# Patient Record
Sex: Male | Born: 1965 | Race: Black or African American | Hispanic: No | Marital: Married | Smoking: Former smoker
Health system: Southern US, Community
[De-identification: ages and names within clinical notes are randomized; demographics above are authoritative.]

## PROBLEM LIST (undated history)

## (undated) DIAGNOSIS — I10 Essential (primary) hypertension: Secondary | ICD-10-CM

## (undated) DIAGNOSIS — E7800 Pure hypercholesterolemia, unspecified: Secondary | ICD-10-CM

## (undated) DIAGNOSIS — M1990 Unspecified osteoarthritis, unspecified site: Secondary | ICD-10-CM

## (undated) DIAGNOSIS — R7989 Other specified abnormal findings of blood chemistry: Secondary | ICD-10-CM

---

## 2001-12-15 ENCOUNTER — Encounter: Payer: Self-pay | Admitting: Family Medicine

## 2001-12-15 ENCOUNTER — Ambulatory Visit (HOSPITAL_COMMUNITY): Admission: RE | Admit: 2001-12-15 | Discharge: 2001-12-15 | Payer: Self-pay | Admitting: Family Medicine

## 2010-03-02 ENCOUNTER — Emergency Department (HOSPITAL_COMMUNITY): Admission: EM | Admit: 2010-03-02 | Discharge: 2010-03-02 | Payer: Self-pay | Admitting: Emergency Medicine

## 2010-12-13 ENCOUNTER — Emergency Department (HOSPITAL_COMMUNITY)
Admission: EM | Admit: 2010-12-13 | Discharge: 2010-12-14 | Payer: Self-pay | Source: Home / Self Care | Admitting: Emergency Medicine

## 2010-12-13 LAB — CBC
HCT: 41.9 % (ref 39.0–52.0)
Hemoglobin: 15.1 g/dL (ref 13.0–17.0)
MCH: 31.2 pg (ref 26.0–34.0)
MCHC: 36 g/dL (ref 30.0–36.0)
MCV: 86.6 fL (ref 78.0–100.0)
Platelets: 166 10*3/uL (ref 150–400)
RBC: 4.84 MIL/uL (ref 4.22–5.81)
RDW: 12.4 % (ref 11.5–15.5)
WBC: 10 10*3/uL (ref 4.0–10.5)

## 2010-12-13 LAB — DIFFERENTIAL
Basophils Absolute: 0 10*3/uL (ref 0.0–0.1)
Basophils Relative: 0 % (ref 0–1)
Eosinophils Absolute: 0 10*3/uL (ref 0.0–0.7)
Eosinophils Relative: 0 % (ref 0–5)
Lymphocytes Relative: 3 % — ABNORMAL LOW (ref 12–46)
Lymphs Abs: 0.3 10*3/uL — ABNORMAL LOW (ref 0.7–4.0)
Monocytes Absolute: 0.3 10*3/uL (ref 0.1–1.0)
Monocytes Relative: 3 % (ref 3–12)
Neutro Abs: 9.4 10*3/uL — ABNORMAL HIGH (ref 1.7–7.7)
Neutrophils Relative %: 94 % — ABNORMAL HIGH (ref 43–77)

## 2010-12-14 LAB — URINALYSIS, ROUTINE W REFLEX MICROSCOPIC
Hgb urine dipstick: NEGATIVE
Leukocytes, UA: NEGATIVE
Nitrite: NEGATIVE
Protein, ur: 30 mg/dL — AB
Specific Gravity, Urine: >1.03 — ABNORMAL HIGH (ref 1.005–1.030)
Urine Glucose, Fasting: NEGATIVE mg/dL
Urobilinogen, UA: 0.2 mg/dL (ref 0.0–1.0)
pH: 5.5 (ref 5.0–8.0)

## 2010-12-14 LAB — BASIC METABOLIC PANEL
BUN: 12 mg/dL (ref 6–23)
CO2: 26 mEq/L (ref 19–32)
Calcium: 8.9 mg/dL (ref 8.4–10.5)
Chloride: 99 mEq/L (ref 96–112)
Creatinine, Ser: 1.25 mg/dL (ref 0.4–1.5)
GFR calc Af Amer: >60 mL/min (ref >60)
GFR calc non Af Amer: >60 mL/min (ref >60)
Glucose, Bld: 108 mg/dL — ABNORMAL HIGH (ref 70–99)
Potassium: 4 mEq/L (ref 3.5–5.1)
Sodium: 135 mEq/L (ref 135–145)

## 2010-12-14 LAB — URINE MICROSCOPIC-ADD ON

## 2011-02-05 LAB — BASIC METABOLIC PANEL
BUN: 12 mg/dL (ref 6–23)
Chloride: 104 mEq/L (ref 96–112)
Glucose, Bld: 91 mg/dL (ref 70–99)
Potassium: 4.2 mEq/L (ref 3.5–5.1)

## 2011-02-05 LAB — CBC
HCT: 40.9 % (ref 39.0–52.0)
Hemoglobin: 14.3 g/dL (ref 13.0–17.0)
MCV: 88.2 fL (ref 78.0–100.0)
Platelets: 183 10*3/uL (ref 150–400)
RDW: 12.2 % (ref 11.5–15.5)

## 2013-04-23 ENCOUNTER — Emergency Department (HOSPITAL_COMMUNITY)
Admission: EM | Admit: 2013-04-23 | Discharge: 2013-04-23 | Disposition: A | Discharge status: Home / Self Care | Payer: BC Managed Care – PPO | Attending: Emergency Medicine | Admitting: Emergency Medicine

## 2013-04-23 ENCOUNTER — Encounter (HOSPITAL_COMMUNITY): Payer: Self-pay | Admitting: *Deleted

## 2013-04-23 ENCOUNTER — Emergency Department (HOSPITAL_COMMUNITY): Payer: BC Managed Care – PPO

## 2013-04-23 ENCOUNTER — Inpatient Hospital Stay: Admit: 2013-04-23 | Payer: Self-pay | Admitting: Orthopaedic Surgery

## 2013-04-23 DIAGNOSIS — Z79899 Other long term (current) drug therapy: Secondary | ICD-10-CM | POA: Insufficient documentation

## 2013-04-23 DIAGNOSIS — I10 Essential (primary) hypertension: Secondary | ICD-10-CM | POA: Insufficient documentation

## 2013-04-23 DIAGNOSIS — R0602 Shortness of breath: Secondary | ICD-10-CM | POA: Insufficient documentation

## 2013-04-23 DIAGNOSIS — Z87891 Personal history of nicotine dependence: Secondary | ICD-10-CM | POA: Insufficient documentation

## 2013-04-23 DIAGNOSIS — R002 Palpitations: Secondary | ICD-10-CM | POA: Insufficient documentation

## 2013-04-23 DIAGNOSIS — R5381 Other malaise: Secondary | ICD-10-CM | POA: Insufficient documentation

## 2013-04-23 HISTORY — DX: Essential (primary) hypertension: I10

## 2013-04-23 LAB — CBC WITH DIFFERENTIAL/PLATELET
Basophils Absolute: 0 10*3/uL (ref 0.0–0.1)
Basophils Relative: 1 % (ref 0–1)
Eosinophils Absolute: 0.2 10*3/uL (ref 0.0–0.7)
HCT: 39.9 % (ref 39.0–52.0)
MCH: 30.4 pg (ref 26.0–34.0)
MCHC: 35.1 g/dL (ref 30.0–36.0)
Monocytes Absolute: 0.5 10*3/uL (ref 0.1–1.0)
Neutro Abs: 3.6 10*3/uL (ref 1.7–7.7)
RDW: 12.5 % (ref 11.5–15.5)

## 2013-04-23 LAB — COMPREHENSIVE METABOLIC PANEL
AST: 20 U/L (ref 0–37)
Albumin: 4 g/dL (ref 3.5–5.2)
BUN: 16 mg/dL (ref 6–23)
Calcium: 9.3 mg/dL (ref 8.4–10.5)
Chloride: 104 mEq/L (ref 96–112)
Creatinine, Ser: 1.06 mg/dL (ref 0.50–1.35)
Total Bilirubin: 0.4 mg/dL (ref 0.3–1.2)
Total Protein: 7.3 g/dL (ref 6.0–8.3)

## 2013-04-23 LAB — TROPONIN I: Troponin I: <0.3 ng/mL (ref <0.30)

## 2013-04-23 SURGERY — ARTHROPLASTY, HIP, TOTAL, ANTERIOR APPROACH
Anesthesia: Choice | Laterality: Left

## 2013-04-23 NOTE — ED Provider Notes (Signed)
History    This chart was scribed for [name] Lennert, MD by Toya Smothers, ED Scribe. The patient was seen in room APA07/APA07. Patient's care was started at 1525.  CSN: [id number]  Arrival date & time 04/23/13  1525   First MD Initiated Contact with Patient 04/23/13 1540      Chief Complaint  Patient presents with  . Tachycardia  . Fatigue    Patient is a 47 y.o. male presenting with palpitations. The history is provided by the patient. No language interpreter was used.  Palpitations Palpitations quality:  Fast Onset quality:  Sudden Duration:  1 hour Timing:  Rare Progression:  Resolved Chronicity:  New Associated symptoms: shortness of breath   Associated symptoms: no back pain, no chest pain and no cough   Risk factors comment:  HTN   HPI Comments:  [name] [name] is a 47 y.o. male with h/o HTN, who presents to the Emergency Department complaining of new, suden onset, episode of palpations while driving. Episode lasted for approximately 1 hour. Pain was mild, though it has subsided. Pt endorses associate mild SOB and fatigue. Symptoms have not been treated PTA. Pt denies headache, diaphoresis, fever, chills, nausea, vomiting, diarrhea, weakness, cough, and any other pain. Pt denies use of tobacco, alcohol, and illicit drug use. Pt denotes no hx of MI.     Past Medical History  Diagnosis Date  . Hypertension     History reviewed. No pertinent past surgical history.  History reviewed. No pertinent family history.  History  Substance Use Topics  . Smoking status: Former Games developer  . Smokeless tobacco: Not on file  . Alcohol Use: No      Review of Systems  Constitutional: Positive for fatigue. Negative for appetite change.  HENT: Negative for congestion, sinus pressure and ear discharge.   Eyes: Negative for discharge.  Respiratory: Positive for shortness of breath. Negative for cough.   Cardiovascular: Positive for palpitations. Negative for chest pain.   Gastrointestinal: Negative for abdominal pain and diarrhea.  Genitourinary: Negative for frequency and hematuria.  Musculoskeletal: Negative for back pain.  Skin: Negative for rash.  Neurological: Negative for seizures and headaches.  Psychiatric/Behavioral: Negative for hallucinations.    Allergies  Review of patient's allergies indicates not on file.  Home Medications   Current Outpatient Rx  Name  Route  Sig  Dispense  Refill  . amLODipine (NORVASC) 5 MG tablet   Oral   Take 5 mg by mouth daily.         . methocarbamol (ROBAXIN) 500 MG tablet   Oral   Take 500 mg by mouth every 6 (six) hours as needed (for  muscle spasms).         . naproxen (NAPROSYN) 500 MG tablet   Oral   Take 500 mg by mouth 2 (two) times daily as needed (for pain).         . traMADol (ULTRAM) 50 MG tablet   Oral   Take 50-100 mg by mouth 2 (two) times daily as needed for pain (for sever pain).           BP 129/85  Temp(Src) 98.7 F (37.1 C) (Oral)  Resp 24  Ht 5\' 11"  (1.803 m)  Wt 290 lb (131.543 kg)  BMI 40.46 kg/m2  SpO2 95%  Physical Exam  Constitutional: He is oriented to person, place, and time. He appears well-developed.  HENT:  Head: Normocephalic.  Eyes: Conjunctivae and EOM are normal. No scleral icterus.  Neck:  Neck supple. No thyromegaly present.  Cardiovascular: Normal rate and regular rhythm.  Exam reveals no gallop and no friction rub.   No murmur heard. Pulmonary/Chest: No stridor. He has no wheezes. He has no rales. He exhibits no tenderness.  Abdominal: He exhibits no distension. There is no tenderness. There is no rebound.  Musculoskeletal: Normal range of motion. He exhibits no edema.  Lymphadenopathy:    He has no cervical adenopathy.  Neurological: He is oriented to person, place, and time. Coordination normal.  Skin: No rash noted. No erythema.  Psychiatric: He has a normal mood and affect. His behavior is normal.    ED Course  Procedures DIAGNOSTIC  STUDIES: Oxygen Saturation is 95% on room air, adequate by my interpretation.    COORDINATION OF CARE: 16:00- Evaluated Pt. Pt is awake, alert, and without distress. 16:05- Patient  understand and agree with initial ED impression and plan with expectations set for ED visit.         Labs Reviewed  COMPREHENSIVE METABOLIC PANEL - Abnormal; Notable for the following:    Glucose, Bld 108 (*)    GFR calc non Af Amer 81 (*)    All other components within normal limits  CBC WITH DIFFERENTIAL  TROPONIN I   Dg Chest 2 View  04/23/2013   *RADIOLOGY REPORT*  Clinical Data: Shortness of breath, tachycardia  CHEST - 2 VIEW  Comparison: None.  Findings: Lungs are clear.  No pleural effusion or pneumothorax.  The heart is normal in size.  Visualized thoracolumbar spine is within normal limits. Degenerative changes of the left shoulder.  IMPRESSION: No evidence of acute cardiopulmonary disease.   Original Report Authenticated By: Charline Bills, M.D.     No diagnosis found.   Date: 04/23/2013  Rate: 88  Rhythm: normal sinus rhythm  QRS Axis: normal  Intervals: normal  ST/T Wave abnormalities: normal  Conduction Disutrbances:none  Narrative Interpretation:   Old EKG Reviewed: unchanged    MDM   Palpitaions hx of normal treadmill,  Will follow up with pcp    The chart was scribed for me under my direct supervision.  I personally performed the history, physical, and medical decision making and all procedures in the evaluation of this patient.. The chart was scribed for me under my direct supervision.  I personally performed the history, physical, and medical decision making and all procedures in the evaluation of this patient.[name] Lennert, MD 04/23/13 [phone]

## 2013-04-23 NOTE — ED Notes (Signed)
Pt c/o heart racing since 1300 today with SOB, denies CP

## 2013-05-03 ENCOUNTER — Other Ambulatory Visit (HOSPITAL_COMMUNITY): Payer: Self-pay | Admitting: Orthopaedic Surgery

## 2013-06-03 ENCOUNTER — Encounter (HOSPITAL_COMMUNITY): Payer: Self-pay | Admitting: Pharmacy Technician

## 2013-06-09 ENCOUNTER — Encounter (HOSPITAL_COMMUNITY)
Admission: RE | Admit: 2013-06-09 | Discharge: 2013-06-09 | Disposition: A | Discharge status: Home / Self Care | Payer: BC Managed Care – PPO | Source: Ambulatory Visit | Attending: Orthopaedic Surgery | Admitting: Orthopaedic Surgery

## 2013-06-09 ENCOUNTER — Encounter (HOSPITAL_COMMUNITY): Payer: Self-pay

## 2013-06-09 HISTORY — DX: Unspecified osteoarthritis, unspecified site: M19.90

## 2013-06-09 LAB — SURGICAL PCR SCREEN
MRSA, PCR: NEGATIVE
Staphylococcus aureus: POSITIVE — AB

## 2013-06-09 LAB — CBC
HCT: 42.4 % (ref 39.0–52.0)
Hemoglobin: 14.7 g/dL (ref 13.0–17.0)
MCHC: 34.7 g/dL (ref 30.0–36.0)

## 2013-06-09 LAB — URINALYSIS, ROUTINE W REFLEX MICROSCOPIC
Glucose, UA: NEGATIVE mg/dL
Hgb urine dipstick: NEGATIVE
Protein, ur: NEGATIVE mg/dL
Specific Gravity, Urine: 1.026 (ref 1.005–1.030)
pH: 6.5 (ref 5.0–8.0)

## 2013-06-09 LAB — BASIC METABOLIC PANEL
BUN: 18 mg/dL (ref 6–23)
Chloride: 99 mEq/L (ref 96–112)
GFR calc Af Amer: 70 mL/min — ABNORMAL LOW (ref >90)
Potassium: 4.3 mEq/L (ref 3.5–5.1)

## 2013-06-09 LAB — PROTIME-INR
INR: 1.05 (ref 0.00–1.49)
Prothrombin Time: 13.5 seconds (ref 11.6–15.2)

## 2013-06-09 LAB — APTT: aPTT: 30 seconds (ref 24–37)

## 2013-06-09 LAB — ABO/RH: ABO/RH(D): A POS

## 2013-06-09 NOTE — Progress Notes (Signed)
06/09/13 1629  OBSTRUCTIVE SLEEP APNEA  Have you ever been diagnosed with sleep apnea through a sleep study? No  Do you snore loudly (loud enough to be heard through closed doors)?  0  Do you often feel tired, fatigued, or sleepy during the daytime? 1  Has anyone observed you stop breathing during your sleep? 0  Do you have, or are you being treated for high blood pressure? 1  BMI more than 35 kg/m2? 1  Age over 49 years old? 0  Neck circumference greater than 40 cm/18 inches? 1  Gender: 1  Obstructive Sleep Apnea Score 5  Score 4 or greater  Results sent to PCP

## 2013-06-09 NOTE — Patient Instructions (Addendum)
YOUR SURGERY IS SCHEDULED AT [facility]  ON:  Friday 7/25  REPORT TO [facility] AT:  9:30 AM      PHONE # FOR SHORT STAY IS [phone]  DO NOT EAT  ANYTHING AFTER MIDNIGHT THE NIGHT BEFORE YOUR SURGERY.  NO FOOD, NO CHEWING GUM, NO MINTS, NO CANDIES, NO CHEWING TOBACCO.  YOU MAY HAVE CLEAR LIQUIDS TO DRINK FROM MIDNIGHT UNTIL 6:00 AM DAY OF YOUR SURGERY - LIKE WATER, SODA.       NOTHING TO DRINK AFTER 6:00 AM DAY OF YOUR SURGERY.  PLEASE TAKE THE FOLLOWING MEDICATIONS THE AM OF YOUR SURGERY WITH A FEW SIPS OF WATER:  AMLODIPINE, TRAMADOL   DO NOT BRING VALUABLES, MONEY, CREDIT CARDS.  DO NOT WEAR JEWELRY, MAKE-UP, NAIL POLISH AND NO METAL PINS OR CLIPS IN YOUR HAIR. CONTACT LENS, DENTURES / PARTIALS, GLASSES SHOULD NOT BE WORN TO SURGERY AND IN MOST CASES-HEARING AIDS WILL NEED TO BE REMOVED.  BRING YOUR GLASSES CASE, ANY EQUIPMENT NEEDED FOR YOUR CONTACT LENS. FOR PATIENTS ADMITTED TO THE HOSPITAL--CHECK OUT TIME THE DAY OF DISCHARGE IS 11:00 AM.  ALL INPATIENT ROOMS ARE PRIVATE - WITH BATHROOM, TELEPHONE, TELEVISION AND WIFI INTERNET.                            PLEASE READ OVER ANY  FACT SHEETS THAT YOU WERE GIVEN: MRSA INFORMATION, BLOOD TRANSFUSION FACT SHEET. FAILURE TO FOLLOW THESE INSTRUCTIONS MAY RESULT IN THE CANCELLATION OF YOUR SURGERY.   PATIENT SIGNATURE_________________________________

## 2013-06-09 NOTE — Pre-Procedure Instructions (Signed)
EKG AND CXR REPORTS ARE IN EPIC FROM 04/23/13.

## 2013-06-11 ENCOUNTER — Inpatient Hospital Stay (HOSPITAL_COMMUNITY)
Admission: RE | Admit: 2013-06-11 | Discharge: 2013-06-13 | DRG: 818 | Disposition: A | Discharge status: Home Health | Payer: BC Managed Care – PPO | Source: Ambulatory Visit | Attending: Orthopaedic Surgery | Admitting: Orthopaedic Surgery

## 2013-06-11 ENCOUNTER — Ambulatory Visit (HOSPITAL_COMMUNITY): Payer: BC Managed Care – PPO

## 2013-06-11 ENCOUNTER — Encounter (HOSPITAL_COMMUNITY)
Admission: RE | Disposition: A | Discharge status: Home / Self Care | Payer: Self-pay | Source: Ambulatory Visit | Attending: Orthopaedic Surgery

## 2013-06-11 ENCOUNTER — Inpatient Hospital Stay (HOSPITAL_COMMUNITY): Payer: BC Managed Care – PPO

## 2013-06-11 ENCOUNTER — Encounter (HOSPITAL_COMMUNITY): Payer: Self-pay | Admitting: Anesthesiology

## 2013-06-11 ENCOUNTER — Encounter (HOSPITAL_COMMUNITY): Payer: Self-pay | Admitting: *Deleted

## 2013-06-11 ENCOUNTER — Ambulatory Visit (HOSPITAL_COMMUNITY): Payer: BC Managed Care – PPO | Admitting: Anesthesiology

## 2013-06-11 DIAGNOSIS — M169 Osteoarthritis of hip, unspecified: Secondary | ICD-10-CM

## 2013-06-11 DIAGNOSIS — Z6841 Body Mass Index (BMI) 40.0 and over, adult: Secondary | ICD-10-CM

## 2013-06-11 DIAGNOSIS — I10 Essential (primary) hypertension: Secondary | ICD-10-CM | POA: Diagnosis present

## 2013-06-11 DIAGNOSIS — Z87891 Personal history of nicotine dependence: Secondary | ICD-10-CM

## 2013-06-11 DIAGNOSIS — M1610 Unilateral primary osteoarthritis, unspecified hip: Principal | ICD-10-CM | POA: Diagnosis present

## 2013-06-11 HISTORY — PX: TOTAL HIP ARTHROPLASTY: SHX124

## 2013-06-11 SURGERY — ARTHROPLASTY, HIP, TOTAL, ANTERIOR APPROACH
Anesthesia: General | Site: Hip | Laterality: Right | Wound class: Clean

## 2013-06-11 MED ORDER — SODIUM CHLORIDE 0.9 % IR SOLN
Status: DC | PRN
  Administered 2013-06-11: 1000 mL

## 2013-06-11 MED ORDER — ALUM & MAG HYDROXIDE-SIMETH 200-200-20 MG/5ML PO SUSP: 30.0000 mL | ORAL | Status: DC | PRN

## 2013-06-11 MED ORDER — SUCCINYLCHOLINE CHLORIDE 20 MG/ML IJ SOLN
INTRAMUSCULAR | Status: DC | PRN
  Administered 2013-06-11: 140 mg via INTRAVENOUS

## 2013-06-11 MED ORDER — HYDROMORPHONE HCL PF 1 MG/ML IJ SOLN: 1.0000 mg | INTRAMUSCULAR | Status: DC | PRN

## 2013-06-11 MED ORDER — OXYCODONE HCL 5 MG PO TABS
5.0000 mg | ORAL_TABLET | ORAL | Status: DC | PRN
  Administered 2013-06-11: 10 mg via ORAL
  Administered 2013-06-12: 5 mg via ORAL
  Administered 2013-06-12: 10 mg via ORAL
  Filled 2013-06-11: qty 2
  Filled 2013-06-11: qty 1
  Filled 2013-06-11: qty 2

## 2013-06-11 MED ORDER — ONDANSETRON HCL 4 MG/2ML IJ SOLN: 4.0000 mg | Freq: Four times a day (QID) | INTRAMUSCULAR | Status: DC | PRN

## 2013-06-11 MED ORDER — ZOLPIDEM TARTRATE 5 MG PO TABS: 5.0000 mg | ORAL_TABLET | Freq: Every evening | ORAL | Status: DC | PRN

## 2013-06-11 MED ORDER — SODIUM CHLORIDE 0.9 % IV SOLN
INTRAVENOUS | Status: DC
  Administered 2013-06-11: 17:00:00 via INTRAVENOUS

## 2013-06-11 MED ORDER — METHOCARBAMOL 100 MG/ML IJ SOLN
500.0000 mg | Freq: Four times a day (QID) | INTRAVENOUS | Status: DC | PRN
  Administered 2013-06-11: 500 mg via INTRAVENOUS
  Filled 2013-06-11: qty 5

## 2013-06-11 MED ORDER — CISATRACURIUM BESYLATE (PF) 10 MG/5ML IV SOLN
INTRAVENOUS | Status: DC | PRN
  Administered 2013-06-11: 10 mg via INTRAVENOUS
  Administered 2013-06-11 (×2): 2 mg via INTRAVENOUS

## 2013-06-11 MED ORDER — METHOCARBAMOL 500 MG PO TABS
500.0000 mg | ORAL_TABLET | Freq: Four times a day (QID) | ORAL | Status: DC | PRN
  Administered 2013-06-12: 500 mg via ORAL
  Filled 2013-06-11: qty 1

## 2013-06-11 MED ORDER — AMLODIPINE BESYLATE 5 MG PO TABS
5.0000 mg | ORAL_TABLET | Freq: Every morning | ORAL | Status: DC
  Filled 2013-06-11 (×2): qty 1

## 2013-06-11 MED ORDER — DOCUSATE SODIUM 100 MG PO CAPS
100.0000 mg | ORAL_CAPSULE | Freq: Two times a day (BID) | ORAL | Status: DC
  Administered 2013-06-11 – 2013-06-13 (×4): 100 mg via ORAL
  Filled 2013-06-11 (×6): qty 1

## 2013-06-11 MED ORDER — HYDROMORPHONE HCL PF 1 MG/ML IJ SOLN
0.2500 mg | INTRAMUSCULAR | Status: DC | PRN
  Administered 2013-06-11: 0.25 mg via INTRAVENOUS

## 2013-06-11 MED ORDER — METOCLOPRAMIDE HCL 10 MG PO TABS: 5.0000 mg | ORAL_TABLET | Freq: Three times a day (TID) | ORAL | Status: DC | PRN

## 2013-06-11 MED ORDER — LACTATED RINGERS IV SOLN
INTRAVENOUS | Status: DC
Start: 2013-06-11 — End: 2013-06-11
  Administered 2013-06-11: 1000 mL via INTRAVENOUS
  Administered 2013-06-11 (×2): via INTRAVENOUS

## 2013-06-11 MED ORDER — ONDANSETRON HCL 4 MG/2ML IJ SOLN
INTRAMUSCULAR | Status: DC | PRN
  Administered 2013-06-11 (×2): 2 mg via INTRAVENOUS

## 2013-06-11 MED ORDER — CEFAZOLIN SODIUM-DEXTROSE 2-3 GM-% IV SOLR
INTRAVENOUS | Status: AC
  Filled 2013-06-11: qty 50

## 2013-06-11 MED ORDER — NEOSTIGMINE METHYLSULFATE 1 MG/ML IJ SOLN
INTRAMUSCULAR | Status: DC | PRN
  Administered 2013-06-11: 2 mg via INTRAVENOUS

## 2013-06-11 MED ORDER — PHENOL 1.4 % MT LIQD: 1.0000 | OROMUCOSAL | Status: DC | PRN

## 2013-06-11 MED ORDER — MIDAZOLAM HCL 5 MG/5ML IJ SOLN
INTRAMUSCULAR | Status: DC | PRN
  Administered 2013-06-11 (×2): 1 mg via INTRAVENOUS

## 2013-06-11 MED ORDER — PROMETHAZINE HCL 25 MG/ML IJ SOLN: 6.2500 mg | INTRAMUSCULAR | Status: DC | PRN

## 2013-06-11 MED ORDER — POLYETHYLENE GLYCOL 3350 17 G PO PACK
17.0000 g | PACK | Freq: Every day | ORAL | Status: DC | PRN
  Filled 2013-06-11: qty 1

## 2013-06-11 MED ORDER — DEXAMETHASONE SODIUM PHOSPHATE 10 MG/ML IJ SOLN
INTRAMUSCULAR | Status: DC | PRN
  Administered 2013-06-11: 10 mg via INTRAVENOUS

## 2013-06-11 MED ORDER — FENTANYL CITRATE 0.05 MG/ML IJ SOLN
INTRAMUSCULAR | Status: DC | PRN
  Administered 2013-06-11 (×3): 50 ug via INTRAVENOUS
  Administered 2013-06-11: 100 ug via INTRAVENOUS
  Administered 2013-06-11 (×3): 50 ug via INTRAVENOUS

## 2013-06-11 MED ORDER — CEFAZOLIN SODIUM-DEXTROSE 2-3 GM-% IV SOLR
2.0000 g | Freq: Four times a day (QID) | INTRAVENOUS | Status: AC
  Administered 2013-06-11 (×2): 2 g via INTRAVENOUS
  Filled 2013-06-11 (×2): qty 50

## 2013-06-11 MED ORDER — EPHEDRINE SULFATE 50 MG/ML IJ SOLN
INTRAMUSCULAR | Status: DC | PRN
  Administered 2013-06-11 (×4): 5 mg via INTRAVENOUS

## 2013-06-11 MED ORDER — ONDANSETRON HCL 4 MG PO TABS: 4.0000 mg | ORAL_TABLET | Freq: Four times a day (QID) | ORAL | Status: DC | PRN

## 2013-06-11 MED ORDER — MENTHOL 3 MG MT LOZG
1.0000 | LOZENGE | OROMUCOSAL | Status: DC | PRN
  Filled 2013-06-11: qty 9

## 2013-06-11 MED ORDER — METOCLOPRAMIDE HCL 5 MG/ML IJ SOLN: 5.0000 mg | Freq: Three times a day (TID) | INTRAMUSCULAR | Status: DC | PRN

## 2013-06-11 MED ORDER — GLYCOPYRROLATE 0.2 MG/ML IJ SOLN
INTRAMUSCULAR | Status: DC | PRN
  Administered 2013-06-11: 0.2 mg via INTRAVENOUS
  Administered 2013-06-11: .4 mg via INTRAVENOUS

## 2013-06-11 MED ORDER — ASPIRIN EC 325 MG PO TBEC
325.0000 mg | DELAYED_RELEASE_TABLET | Freq: Two times a day (BID) | ORAL | Status: DC
  Administered 2013-06-11 – 2013-06-13 (×4): 325 mg via ORAL
  Filled 2013-06-11 (×6): qty 1

## 2013-06-11 MED ORDER — DEXTROSE 5 % IV SOLN
3.0000 g | INTRAVENOUS | Status: AC
  Administered 2013-06-11: 3 g via INTRAVENOUS
  Filled 2013-06-11: qty 3000

## 2013-06-11 MED ORDER — HYDROMORPHONE HCL PF 1 MG/ML IJ SOLN
INTRAMUSCULAR | Status: AC
  Filled 2013-06-11: qty 1

## 2013-06-11 MED ORDER — ACETAMINOPHEN 325 MG PO TABS
650.0000 mg | ORAL_TABLET | Freq: Four times a day (QID) | ORAL | Status: DC | PRN
  Administered 2013-06-12: 650 mg via ORAL
  Filled 2013-06-11: qty 2

## 2013-06-11 MED ORDER — ACETAMINOPHEN 650 MG RE SUPP: 650.0000 mg | Freq: Four times a day (QID) | RECTAL | Status: DC | PRN

## 2013-06-11 MED ORDER — PROPOFOL 10 MG/ML IV BOLUS
INTRAVENOUS | Status: DC | PRN
  Administered 2013-06-11: 225 mg via INTRAVENOUS

## 2013-06-11 MED ORDER — CEFAZOLIN SODIUM 1-5 GM-% IV SOLN
INTRAVENOUS | Status: AC
  Filled 2013-06-11: qty 50

## 2013-06-11 MED ORDER — DIPHENHYDRAMINE HCL 12.5 MG/5ML PO ELIX: 12.5000 mg | ORAL_SOLUTION | ORAL | Status: DC | PRN

## 2013-06-11 MED ORDER — 0.9 % SODIUM CHLORIDE (POUR BTL) OPTIME
TOPICAL | Status: DC | PRN
  Administered 2013-06-11: 1000 mL

## 2013-06-11 MED ORDER — OXYCODONE HCL ER 20 MG PO T12A
20.0000 mg | EXTENDED_RELEASE_TABLET | Freq: Two times a day (BID) | ORAL | Status: DC
  Administered 2013-06-11 – 2013-06-13 (×4): 20 mg via ORAL
  Filled 2013-06-11 (×4): qty 1

## 2013-06-11 MED ORDER — LIDOCAINE HCL (CARDIAC) 20 MG/ML IV SOLN
INTRAVENOUS | Status: DC | PRN
  Administered 2013-06-11: 100 mg via INTRAVENOUS

## 2013-06-11 SURGICAL SUPPLY — 39 items
ADH SKN CLS APL DERMABOND .7 (GAUZE/BANDAGES/DRESSINGS) ×1
BAG SPEC THK2 15X12 ZIP CLS (MISCELLANEOUS)
BAG ZIPLOCK 12X15 (MISCELLANEOUS) ×2 IMPLANT
BLADE SAW SGTL 18X1.27X75 (BLADE) ×2 IMPLANT
CAPT HIP PF COP ×1 IMPLANT
CELLS DAT CNTRL 66122 CELL SVR (MISCELLANEOUS) ×1 IMPLANT
CLOTH BEACON ORANGE TIMEOUT ST (SAFETY) ×2 IMPLANT
DERMABOND ADVANCED (GAUZE/BANDAGES/DRESSINGS) ×1
DERMABOND ADVANCED .7 DNX12 (GAUZE/BANDAGES/DRESSINGS) ×1 IMPLANT
DRAPE C-ARM 42X120 X-RAY (DRAPES) ×2 IMPLANT
DRAPE STERI IOBAN 125X83 (DRAPES) ×2 IMPLANT
DRAPE U-SHAPE 47X51 STRL (DRAPES) ×6 IMPLANT
DRSG AQUACEL AG ADV 3.5X10 (GAUZE/BANDAGES/DRESSINGS) ×2 IMPLANT
DURAPREP 26ML APPLICATOR (WOUND CARE) ×2 IMPLANT
ELECT BLADE TIP CTD 4 INCH (ELECTRODE) ×2 IMPLANT
ELECT REM PT RETURN 9FT ADLT (ELECTROSURGICAL) ×2
ELECTRODE REM PT RTRN 9FT ADLT (ELECTROSURGICAL) ×1 IMPLANT
FACESHIELD LNG OPTICON STERILE (SAFETY) ×8 IMPLANT
GLOVE BIO SURGEON STRL SZ7.5 (GLOVE) ×2 IMPLANT
GLOVE BIOGEL PI IND STRL 8 (GLOVE) ×2 IMPLANT
GLOVE BIOGEL PI INDICATOR 8 (GLOVE) ×2
GLOVE ECLIPSE 8.0 STRL XLNG CF (GLOVE) ×2 IMPLANT
GOWN STRL REIN XL XLG (GOWN DISPOSABLE) ×4 IMPLANT
HANDPIECE INTERPULSE COAX TIP (DISPOSABLE) ×2
KIT BASIN OR (CUSTOM PROCEDURE TRAY) ×2 IMPLANT
PACK TOTAL JOINT (CUSTOM PROCEDURE TRAY) ×2 IMPLANT
PADDING CAST COTTON 6X4 STRL (CAST SUPPLIES) ×2 IMPLANT
RTRCTR WOUND ALEXIS 18CM MED (MISCELLANEOUS) ×2
SET HNDPC FAN SPRY TIP SCT (DISPOSABLE) ×1 IMPLANT
SUT ETHIBOND NAB CT1 #1 30IN (SUTURE) ×2 IMPLANT
SUT ETHILON 3 0 PS 1 (SUTURE) ×1 IMPLANT
SUT MNCRL AB 4-0 PS2 18 (SUTURE) ×2 IMPLANT
SUT VIC AB 0 CT1 36 (SUTURE) ×2 IMPLANT
SUT VIC AB 1 CT1 36 (SUTURE) ×4 IMPLANT
SUT VIC AB 2-0 CT1 27 (SUTURE) ×6
SUT VIC AB 2-0 CT1 TAPERPNT 27 (SUTURE) ×2 IMPLANT
TOWEL OR 17X26 10 PK STRL BLUE (TOWEL DISPOSABLE) ×2 IMPLANT
TOWEL OR NON WOVEN STRL DISP B (DISPOSABLE) ×2 IMPLANT
TRAY FOLEY METER SIL LF 16FR (CATHETERS) ×1 IMPLANT

## 2013-06-11 NOTE — Anesthesia Procedure Notes (Addendum)
Procedure Name: Intubation Date/Time: 06/11/2013 12:13 PM Performed by: Edison Pace Pre-anesthesia Checklist: Patient identified, Timeout performed, Emergency Drugs available, Suction available and Patient being monitored Patient Re-evaluated:Patient Re-evaluated prior to inductionOxygen Delivery Method: Circle system utilized Preoxygenation: Pre-oxygenation with 100% oxygen Intubation Type: IV induction and Cricoid Pressure applied Ventilation: Two handed mask ventilation required Laryngoscope Size: Mac and 4 Grade View: Grade II Tube type: Oral Tube size: 8.0 mm Number of attempts: 1 (advanced once after initial placement due to too shallow. cuff leak.  sealed at 24 at lip) Airway Equipment and Method: Stylet Placement Confirmation: ETT inserted through vocal cords under direct vision,  breath sounds checked- equal and bilateral and positive ETCO2 Secured at: 24 cm Tube secured with: Tape Dental Injury: Teeth and Oropharynx as per pre-operative assessment  Difficulty Due To: Difficulty was anticipated, Difficult Airway- due to limited oral opening, Difficult Airway- due to dentition, Difficult Airway- due to anterior larynx, Difficult Airway- due to large tongue and Difficult Airway- due to reduced neck mobility Future Recommendations: Recommend- induction with short-acting agent, and alternative techniques readily available Comments: Loose teeth intact after intubation

## 2013-06-11 NOTE — Anesthesia Postprocedure Evaluation (Signed)
  Anesthesia Post-op Note  Patient: [name] [name]  Procedure(s) Performed: Procedure(s) (LRB): RIGHT TOTAL HIP ARTHROPLASTY ANTERIOR APPROACH (Right)  Patient Location: PACU  Anesthesia Type: General  Level of Consciousness: awake and alert   Airway and Oxygen Therapy: Patient Spontanous Breathing  Post-op Pain: mild  Post-op Assessment: Post-op Vital signs reviewed, Patient's Cardiovascular Status Stable, Respiratory Function Stable, Patent Airway and No signs of Nausea or vomiting  Last Vitals:  Filed Vitals:   06/11/13 1615  BP: 136/70  Pulse: 90  Temp: 36.3 C  Resp: 16    Post-op Vital Signs: stable   Complications: No apparent anesthesia complications

## 2013-06-11 NOTE — Op Note (Signed)
[name] [name], [name] [name]              ACCOUNT NO.:  [id number]  MEDICAL RECORD NO.:  [id number]  LOCATION:  [facility]                         FACILITY:  [facility]  PHYSICIAN:  Vanita Panda. Magnus Ivan, M.D.DATE OF BIRTH:  June 22, 1966  DATE OF PROCEDURE:  06/11/2013 DATE OF DISCHARGE:                              OPERATIVE REPORT   PREOPERATIVE DIAGNOSES:  Severe end-stage arthritis and degenerative joint disease, right hip.  POSTOPERATIVE DIAGNOSES:  Severe end-stage arthritis and degenerative joint disease, right hip.  PROCEDURE:  Right total hip arthroplasty through direct anterior approach.  IMPLANTS:  DePuy Sector Gription acetabular component size 52, apex hole eliminator guide, size 36 plus 4 polyethylene liner, size 12 Corail femoral component with varus offset (KLA), size 36 plus 1.5 ceramic hip ball.  SURGEON:  Vanita Panda. Magnus Ivan, M.D.  ASSISTANT:  Richardean Canal, PA-C.  ANESTHESIA:  General.  ANTIBIOTICS:  IV Ancef.  BLOOD LOSS:  Less than 500 mL.  COMPLICATIONS:  None.  INDICATIONS:  [name] [name] is a 47 year old obese individual with severe debilitating arthritis of both his hips of the right worse than the left.  It has become so painful to him.  He is a Naval architect.  He has x- rays that show complete loss of joint space with subchondral sclerosis, periarticular osteophytes, and cystic changes.  With the failure of conservative treatment, and his daily pain as well as decreased mobility, and negative impact his quality of life, he wished to proceed with a total hip arthroplasty through direct anterior approach.  The risks and benefits of the surgery were explained to him in detail including the risk of acute blood loss anemia, nerve or vessel injury, fracture, DVT, and infection.  The goals are decreased pain, improved mobility, which will lead to improve quality of life.  DESCRIPTION OF PROCEDURE:  After informed consent was obtained, appropriate right hip was  marked.  He was brought to the operating room. General anesthesia was obtained, he was on the stretcher.  Foley catheter was placed and traction boots were placed on both of his feet. Next, he was placed supine on the Hana fracture table with the perineal post in place and both legs in inline skeletal traction, but no traction applied.  His right operative hip was then prepped and draped with DuraPrep and sterile drapes.  A time-out was called to identify correct patient, correct right hip.  We then made an incision just inferior and posterior to the anterior superior iliac spine and carried this obliquely down the leg.  We dissected down to the tensor fascia lata muscle and the tensor fascia was divided longitudinally.  I then cauterized the lateral femoral circumflex vessels and placed Cobra retractors around the lateral neck and then one up underneath the rectus femoris around the medial neck.  I then opened up the joint capsule in a L-type format and found a large joint effusion.  I placed the Cobra retractors within the joint capsule.  Next, I made a femoral neck cut just proximal to the lesser trochanter using an oscillating saw and completed this with an osteotome.  I placed a corkscrew guide in the femoral head and removed femoral head its entirety and found to  be devoid of cartilage.  We then cleaned the acetabular debris and released to transverse acetabular ligament.  I placed a Bent Hohmann medially and a Cobra retractor laterally, and began reaming in 2 mm increments from size 42 up to a size 52 with all reamers placed under direct visualization.  The last reamer also placed with direct fluoroscopy, so we could obtain our depth with bringing inclination and anteversion. Once I was pleased with the position, I placed the real DePuy Sector Gription acetabular component size 52, and apex hole eliminator guide as well as 36 plus 4 neutral polyethylene liner.  Attention was then  turned to the femur with the leg externally rotated about 100 degrees extended and adducted.  I placed a medial retractor medially and a Hohmann retractor behind the greater trochanter.  I released the lateral joint capsule and then used a box cutting osteotome to enter the femoral canal and a rongeur to lateralize then began broaching from a size 8 broach up to a size 12 broach.  Recognized this anatomy with little varus position.  We trialed a varus neck and a 36 plus 1.5 hip ball.  We brought the leg back over and up with traction and internal rotation reduced into the pelvis, it was stable throughout its arc of rotation with minimal shuck and his leg lengths were measured and were equal.  We then dislocated the hip and removed the trial components.  We placed the real size 12 Corail femoral component from DePuy followed by the real 36 plus 1.5 ceramic hip ball and again reduced this in the acetabulum was stable.  We then copiously irrigated the soft tissues with normal saline solution using pulsatile lavage.  We closed the hip capsule with interrupted #1 Ethibond suture followed by running #1 Vicryl in the tensor fascia, 0 Vicryl in the deep tissue, 2-0 Vicryl in subcutaneous tissue, 4-0 Monocryl subcuticular stitch and Dermabond on the skin.  An Aquacel dressing was applied.  He was then taken off the Hana table, awakened, extubated, and taken to recovery room in stable condition. All final counts were correct and no complications noted.  Of note, Richardean Canal, PA-C was present the entire case.  His presence was crucial with the patient's positioning, retracting for implant positioning, and closure of wound.     Vanita Panda. Magnus Ivan, M.D.     CYB/MEDQ  D:  06/11/2013  T:  06/11/2013  Job:  [id number]

## 2013-06-11 NOTE — Brief Op Note (Signed)
06/11/2013  2:12 PM  PATIENT:  [name] [name]  47 y.o. male  PRE-OPERATIVE DIAGNOSIS:  Severe osteoarthritis right hip  POST-OPERATIVE DIAGNOSIS:  Severe osteoarthritis right hip  PROCEDURE:  Procedure(s): RIGHT TOTAL HIP ARTHROPLASTY ANTERIOR APPROACH (Right)  SURGEON:  Surgeon(s) and Role:    * Kathryne Hitch, MD - Primary  PHYSICIAN ASSISTANT: Rexene Edison, PA-C  ANESTHESIA:   general  EBL:  Total I/O In: 1000 [I.V.:1000] Out: 350 [Urine:150; Blood:200]  BLOOD ADMINISTERED:none  DRAINS: none   LOCAL MEDICATIONS USED:  NONE  SPECIMEN:  No Specimen  DISPOSITION OF SPECIMEN:  N/A  COUNTS:  YES  TOURNIQUET:  * No tourniquets in log *  DICTATION: .Other Dictation: Dictation Number [id number]  PLAN OF CARE: Admit to inpatient   PATIENT DISPOSITION:  PACU - hemodynamically stable.   Delay start of Pharmacological VTE agent (>24hrs) due to surgical blood loss or risk of bleeding: no

## 2013-06-11 NOTE — Transfer of Care (Signed)
Immediate Anesthesia Transfer of Care Note  Patient: [name] [name]  Procedure(s) Performed: Procedure(s): RIGHT TOTAL HIP ARTHROPLASTY ANTERIOR APPROACH (Right)  Patient Location: PACU  Anesthesia Type:General  Level of Consciousness: awake, alert , oriented, patient cooperative and responds to stimulation  Airway & Oxygen Therapy: Patient Spontanous Breathing and Patient connected to face mask oxygen  Post-op Assessment: Report given to PACU RN, Post -op Vital signs reviewed and stable and Patient moving all extremities  Post vital signs: Reviewed and stable  Complications: No apparent anesthesia complications

## 2013-06-11 NOTE — H&P (Signed)
TOTAL HIP ADMISSION H&P  Patient is admitted for right total hip arthroplasty.  Subjective:  Chief Complaint: right hip pain  HPI: [name] [name], 47 y.o. male, has a history of pain and functional disability in the right hip(s) due to arthritis and patient has failed non-surgical conservative treatments for greater than 12 weeks to include NSAID's and/or analgesics, weight reduction as appropriate and activity modification.  Onset of symptoms was gradual starting 2 years ago with gradually worsening course since that time.The patient noted no past surgery on the right hip(s).  Patient currently rates pain in the right hip at 8 out of 10 with activity. Patient has night pain, worsening of pain with activity and weight bearing, trendelenberg gait, pain that interfers with activities of daily living and pain with passive range of motion. Patient has evidence of subchondral cysts, subchondral sclerosis, periarticular osteophytes and joint space narrowing by imaging studies. This condition presents safety issues increasing the risk of falls.  There is no current active infection.  Patient Active Problem List   Diagnosis Date Noted  . Degenerative arthritis of hip, right 06/11/2013   Past Medical History  Diagnosis Date  . Hypertension   . Arthritis     SEVERE OA AND PAIN BOTH HIPS     No past surgical history on file.  No prescriptions prior to admission   No Known Allergies  History  Substance Use Topics  . Smoking status: Former Smoker -- 19 years    Types: Cigarettes, Pipe, Cigars  . Smokeless tobacco: Not on file     Comment: QUIT SMOKING 1999  . Alcohol Use: No    No family history on file.   Review of Systems  Musculoskeletal: Positive for joint pain.  All other systems reviewed and are negative.    Objective:  Physical Exam  Constitutional: He is oriented to person, place, and time. He appears well-developed and well-nourished.  HENT:  Head: Normocephalic and  atraumatic.  Eyes: EOM are normal. Pupils are equal, round, and reactive to light.  Neck: Normal range of motion. Neck supple.  Cardiovascular: Normal rate and regular rhythm.   Respiratory: Effort normal and breath sounds normal.  GI: Soft. Bowel sounds are normal.  Musculoskeletal:       Right hip: He exhibits decreased range of motion, decreased strength, bony tenderness and crepitus.  Neurological: He is alert and oriented to person, place, and time.  Skin: Skin is warm and dry.  Psychiatric: He has a normal mood and affect.    Vital signs in last 24 hours:    Labs:   Estimated body mass index is 40.46 kg/(m^2) as calculated from the following:   Height as of 04/23/13: 5\' 11"  (1.803 m).   Weight as of 04/23/13: 131.543 kg (290 lb).   Imaging Review Plain radiographs demonstrate severe degenerative joint disease of the right hip(s). The bone quality appears to be excellent for age and reported activity level.  Assessment/Plan:  End stage arthritis, right hip(s)  The patient history, physical examination, clinical judgement of the provider and imaging studies are consistent with end stage degenerative joint disease of the right hip(s) and total hip arthroplasty is deemed medically necessary. The treatment options including medical management, injection therapy, arthroscopy and arthroplasty were discussed at length. The risks and benefits of total hip arthroplasty were presented and reviewed. The risks due to aseptic loosening, infection, stiffness, dislocation/subluxation,  thromboembolic complications and other imponderables were discussed.  The patient acknowledged the explanation, agreed to proceed with  the plan and consent was signed. Patient is being admitted for inpatient treatment for surgery, pain control, PT, OT, prophylactic antibiotics, VTE prophylaxis, progressive ambulation and ADL's and discharge planning.The patient is planning to be discharged home with home health  services

## 2013-06-11 NOTE — Preoperative (Signed)
Beta Blockers   Reason not to administer Beta Blockers:Not Applicable 

## 2013-06-11 NOTE — Plan of Care (Signed)
Problem: Consults Goal: Diagnosis- Total Joint Replacement Right anterior hip     

## 2013-06-11 NOTE — Anesthesia Preprocedure Evaluation (Addendum)
Anesthesia Evaluation  Patient identified by MRN, date of birth, ID band Patient awake    Reviewed: Allergy & Precautions, H&P , NPO status , Patient's Chart, lab work & pertinent test results  Airway Mallampati: II TM Distance: >3 FB Neck ROM: Full    Dental  (+) Poor Dentition, Loose and Dental Advisory Given Loose upper front teeth.:   Pulmonary former smoker,  breath sounds clear to auscultation  Pulmonary exam normal       Cardiovascular Exercise Tolerance: Good hypertension, Pt. on medications negative cardio ROS  Rhythm:Regular Rate:Normal  ECG 04-26-13 : normal   Neuro/Psych negative neurological ROS  negative psych ROS   GI/Hepatic negative GI ROS, Neg liver ROS,   Endo/Other  Morbid obesity  Renal/GU negative Renal ROS  negative genitourinary   Musculoskeletal negative musculoskeletal ROS (+)   Abdominal (+) + obese,   Peds negative pediatric ROS (+)  Hematology negative hematology ROS (+)   Anesthesia Other Findings   Reproductive/Obstetrics negative OB ROS                         Anesthesia Physical Anesthesia Plan  ASA: III  Anesthesia Plan: General   Post-op Pain Management:    Induction: Intravenous  Airway Management Planned: Oral ETT  Additional Equipment:   Intra-op Plan:   Post-operative Plan: Extubation in OR  Informed Consent: I have reviewed the patients History and Physical, chart, labs and discussed the procedure including the risks, benefits and alternatives for the proposed anesthesia with the patient or authorized representative who has indicated his/her understanding and acceptance.   Dental advisory given  Plan Discussed with: CRNA  Anesthesia Plan Comments: (Discussed r/b general versus spinal.Patient prefers general.)       Anesthesia Quick Evaluation

## 2013-06-12 LAB — CBC
MCV: 87.3 fL (ref 78.0–100.0)
Platelets: 218 10*3/uL (ref 150–400)
RBC: 4.02 MIL/uL — ABNORMAL LOW (ref 4.22–5.81)
RDW: 12.5 % (ref 11.5–15.5)
WBC: 9.9 10*3/uL (ref 4.0–10.5)

## 2013-06-12 LAB — BASIC METABOLIC PANEL
CO2: 28 mEq/L (ref 19–32)
Chloride: 95 mEq/L — ABNORMAL LOW (ref 96–112)
Creatinine, Ser: 0.97 mg/dL (ref 0.50–1.35)
GFR calc Af Amer: >90 mL/min (ref >90)
Potassium: 4.4 mEq/L (ref 3.5–5.1)
Sodium: 131 mEq/L — ABNORMAL LOW (ref 135–145)

## 2013-06-12 MED ORDER — OXYCODONE-ACETAMINOPHEN 5-325 MG PO TABS: 1.0000 | ORAL_TABLET | ORAL | Status: DC | PRN

## 2013-06-12 MED ORDER — ASPIRIN 325 MG PO TBEC: 325.0000 mg | DELAYED_RELEASE_TABLET | Freq: Two times a day (BID) | ORAL | Status: DC

## 2013-06-12 MED ORDER — METHOCARBAMOL 500 MG PO TABS: 500.0000 mg | ORAL_TABLET | Freq: Four times a day (QID) | ORAL | Status: DC | PRN

## 2013-06-12 NOTE — Evaluation (Signed)
Physical Therapy Evaluation Patient Details Name: [name] [name] MRN: [id number] DOB: 08-06-1966 Today's Date: 06/12/2013 Time: 4098-1191 PT Time Calculation (min): 47 min  PT Assessment / Plan / Recommendation History of Present Illness  [name] [name], 47 y.o. male, has a history of pain and functional disability in the right hip(s) due to arthritis and patient has failed non-surgical conservative treatments for greater than 12 weeks to include NSAID's and/or analgesics, weight reduction as appropriate and activity modification.  Onset of symptoms was gradual starting 2 years ago with gradually worsening course since that time.The patient noted no past surgery on the right hip(s).  Patient currently rates pain in the right hip at 8 out of 10 with activity. Patient has night pain, worsening of pain with activity and weight bearing, trendelenberg gait, pain that interfers with activities of daily living and pain with passive range of motion. Patient has evidence of subchondral cysts, subchondral sclerosis, periarticular osteophytes and joint space narrowing by imaging studies. This condition presents safety issues increasing the risk of falls.  There is no current active infection  Clinical Impression  Pt with decreased ROM and strength due to recent RLE hip THA direct anterior approach. Pt to benefit from PT to increase independence of mobility to transition home safely with wife ssisting.     PT Assessment  Patient needs continued PT services    Follow Up Recommendations  Home health PT (or OPPT depending on MD orders)    Does the patient have the potential to tolerate intense rehabilitation      Barriers to Discharge        Equipment Recommendations  Rolling walker with 5" wheels    Recommendations for Other Services     Frequency 7X/week    Precautions / Restrictions Precautions Precautions: None (direct anterior approach with no precautions) Restrictions Weight Bearing  Restrictions: No (WBAT RLE)   Pertinent Vitals/Pain Pt with 5/10 pain with mobility, 0/10 prior to movement. Applied ice after session.       Mobility  Bed Mobility Bed Mobility: Supine to Sit Supine to Sit: 4: Min assist;With rails;HOB elevated (with RLE ) Transfers Transfers: Sit to Stand;Stand to Sit Sit to Stand: 4: Min assist;With upper extremity assist;From bed Stand to Sit: 4: Min assist;With upper extremity assist;To chair/3-in-1 Details for Transfer Assistance: cues for RW safety and sequencing, favored RLE in both directions . used UEs a lot to assist.  Ambulation/Gait Ambulation/Gait Assistance: 4: Min guard Ambulation Distance (Feet): 50 Feet Assistive device: Rolling walker Ambulation/Gait Assistance Details: cues for RW sequencing and safety.cues for staying close to RW and how to trun with RW.  Gait Pattern: Step-to pattern Gait velocity: slow    Exercises Total Joint Exercises Ankle Circles/Pumps: AROM;10 reps;Right Quad Sets: AROM;Right;10 reps;Supine Heel Slides: AAROM;Right;10 reps;Supine Hip ABduction/ADduction: AAROM;Right;10 reps;Supine   PT Diagnosis: Difficulty walking  PT Problem List: Decreased strength;Decreased activity tolerance;Decreased knowledge of use of DME;Decreased safety awareness PT Treatment Interventions: DME instruction;Gait training;Stair training;Functional mobility training;Therapeutic activities;Therapeutic exercise;Patient/family education     PT Goals(Current goals can be found in the care plan section) Acute Rehab PT Goals Patient Stated Goal: to return home and evetually get back to working out, walking, running, lifting weights.  PT Goal Formulation: With patient Time For Goal Achievement: 06/26/13 Potential to Achieve Goals: Good  Visit Information  Last PT Received On: 06/12/13 Assistance Needed: +1 History of Present Illness: [name] [name], 47 y.o. male, has a history of pain and functional disability in the right  hip(s) due  to arthritis and patient has failed non-surgical conservative treatments for greater than 12 weeks to include NSAID's and/or analgesics, weight reduction as appropriate and activity modification.  Onset of symptoms was gradual starting 2 years ago with gradually worsening course since that time.The patient noted no past surgery on the right hip(s).  Patient currently rates pain in the right hip at 8 out of 10 with activity. Patient has night pain, worsening of pain with activity and weight bearing, trendelenberg gait, pain that interfers with activities of daily living and pain with passive range of motion. Patient has evidence of subchondral cysts, subchondral sclerosis, periarticular osteophytes and joint space narrowing by imaging studies. This condition presents safety issues increasing the risk of falls.  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 Was using cane PLOF due to pain.  Communication Communication: No difficulties    Cognition  Cognition Arousal/Alertness: Awake/alert Behavior During Therapy: WFL for tasks assessed/performed Overall Cognitive Status: Within Functional Limits for tasks assessed    Extremity/Trunk Assessment Lower Extremity Assessment Lower Extremity Assessment: RLE deficits/detail RLE Deficits / Details: limited with AAROM for hip exercises due to recent surgery. all LT intact for RLE. grossly 3=/5 for hip moevement   Balance    End of Session PT - End of  Session Equipment Utilized During Treatment: Gait belt Activity Tolerance: Patient tolerated treatment well Patient left: in chair;with call bell/phone within reach Nurse Communication: Mobility status  GP     [name] [name] 06/12/2013, 10:31 AM

## 2013-06-12 NOTE — Care Management Note (Addendum)
   CARE MANAGEMENT NOTE 06/12/2013  Patient:  [name] [name]   Account Number:  [id number]  Date Initiated:  06/12/2013  Documentation initiated by:  [name] [name]  Subjective/Objective Assessment:   s/p right total hip     Action/Plan:   home with home health   Anticipated DC Date:  06/13/2013   Anticipated DC Plan:  HOME W HOME HEALTH SERVICES      DC Planning Services  CM consult      [facility] Choice  HOME HEALTH   Choice offered to / List presented to:  [name]           Status of service:  In process, will continue to follow Medicare Important Message given?   (If response is "NO", the following Medicare IM given date fields will be blank) Date Medicare IM given:   Date Additional Medicare IM given:    Discharge Disposition:  HOME W HOME HEALTH SERVICES  Per UR Regulation:    If discussed at Long Length of Stay Meetings, dates discussed:    Comments:  06/12/13-- [name] [name], RNCM [phone] Met with patient at bedside to offer home health choice. Noted, [name] [name] cannot accept due to out of service area. Gave patient home health agency list to choose from. Will follow up tomorrow as patient could not decide which agency to choose. Of note, patient will need orders per therapy recommendations for [facility] PT or outpatient therapy, RW and wide 3 in 1 commode,. Will follow up in the am. [name] [name], RNCM [phone]

## 2013-06-12 NOTE — Progress Notes (Signed)
Subjective: 1 Day Post-Op Procedure(s) (LRB): RIGHT TOTAL HIP ARTHROPLASTY ANTERIOR APPROACH (Right) Awake, alert and oriented x 4. In good spirits, ambulated short distance today. Voiding post foley discharge.  Patient reports pain as mild.    Objective:   VITALS:  Temp:  [97.4 F (36.3 C)-98.7 F (37.1 C)] 98.1 F (36.7 C) (07/26 1016) Pulse Rate:  [69-98] 87 (07/26 1535) Resp:  [16-20] 20 (07/26 1535) BP: (113-164)/(66-97) 131/75 mmHg (07/26 1016) SpO2:  [95 %-100 %] 100 % (07/26 1535) Weight:  [135.172 kg (298 lb)] 135.172 kg (298 lb) (07/25 1615)  Neurologically intact ABD soft Neurovascular intact Sensation intact distally Intact pulses distally Dorsiflexion/Plantar flexion intact Incision: scant drainage   LABS  Recent Labs  06/12/13 0528  HGB 12.1*  WBC 9.9  PLT 218    Recent Labs  06/12/13 0528  NA 131*  K 4.4  CL 95*  CO2 28  BUN 13  CREATININE 0.97  GLUCOSE 141*   No results found for this basename: LABPT, INR,  in the last 72 hours   Assessment/Plan: 1 Day Post-Op Procedure(s) (LRB): RIGHT TOTAL HIP ARTHROPLASTY ANTERIOR APPROACH (Right)  Advance diet Up with therapy Continue with PT.  NITKA,JAMES E 06/12/2013, 3:35 PM

## 2013-06-12 NOTE — Evaluation (Signed)
Occupational Therapy Evaluation Patient Details Name: [name] [name] MRN: [id number] DOB: 02-27-1966 Today's Date: 06/12/2013 Time: 7829-5621 OT Time Calculation (min): 25 min  OT Assessment / Plan / Recommendation History of present illness Pt is s/p R anterior direct THA   Clinical Impression   Pt was admitted for the above surgery.  He will have wife at home and she will assist with adls as needed.  Pt would benefit from 3:1 commode.  All education was completed.      OT Assessment  Patient does not need any further OT services    Follow Up Recommendations  No OT follow up    Barriers to Discharge      Equipment Recommendations  3 in 1 bedside comode (may need wide (298#). Fits in standard if weight limit will accommodate this)    Recommendations for Other Services    Frequency       Precautions / Restrictions Precautions Precautions: None Restrictions Weight Bearing Restrictions: No (WBAT RLE) Other Position/Activity Restrictions: WBAT   Pertinent Vitals/Pain 5/10 R hip; repositioned with ice    ADL  Grooming: Wash/dry hands;Wash/dry face;Supervision/safety Where Assessed - Grooming: Supported standing Upper Body Bathing: Set up Where Assessed - Upper Body Bathing: Supported standing Lower Body Bathing: Moderate assistance Where Assessed - Lower Body Bathing: Supported sit to stand Upper Body Dressing: Minimal assistance (v) Where Assessed - Upper Body Dressing: Unsupported sitting Lower Body Dressing: Maximal assistance Where Assessed - Lower Body Dressing: Supported sit to stand Toilet Transfer: Min Pension scheme manager Method: Sit to Barista: Raised toilet seat with arms (or 3-in-1 over toilet) Toileting - Clothing Manipulation and Hygiene: Moderate assistance Where Assessed - Toileting Clothing Manipulation and Hygiene: Sit to stand from 3-in-1 or toilet Equipment Used: Rolling walker Transfers/Ambulation Related to ADLs: ambulated  to bathroom with min guard assist. Pt stiff. ADL Comments: Pt 3:1 over commode, set at 19".  Pt has standard commode at home.  Would benefit from this.  Pt stood to wash up at sink.  He needed assistance with anything below knees.  Wife has helped with dressing at home.  Explained adl uses of reacher:  they do have one at home.  Pt verbalizes understanding.  Educated on tub transfer, when ready.  Pt has had difficulty with leg for a year and has been able to get over.  Encouraged him to sponge bathe initially for safety    OT Diagnosis:    OT Problem List:   OT Treatment Interventions:     OT Goals(Current goals can be found in the care plan section) Acute Rehab OT Goals Patient Stated Goal: to return home and evetually get back to working out, walking, running, lifting weights.  ADL Goals Pt/caregiver will Perform Home Exercise Program: For increased strengthening;With Supervision, verbal cues required/provided  Visit Information  Last OT Received On: 06/12/13 Assistance Needed: +1 History of Present Illness: Pt is s/p R anterior direct THA       Prior Functioning     Home Living Family/patient expects to be discharged to:: Private residence Living Arrangements: Spouse/significant other Available Help at Discharge: Family;Available 24 hours/day Type of Home: House Home Access: Stairs to enter [employer] of Steps: 3 Entrance Stairs-Rails: None Home Layout: One level Home Equipment: Cane - single point Additional Comments: sink next to toilet Prior Function Level of Independence: Independent 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 Was using cane PLOF due  to pain.  Communication Communication: No difficulties         Vision/Perception     Cognition  Cognition Arousal/Alertness: Awake/alert Behavior During Therapy: WFL for tasks assessed/performed Overall Cognitive Status: Within Functional Limits for  tasks assessed    Extremity/Trunk Assessment Upper Extremity Assessment Upper Extremity Assessment: Overall WFL for tasks assessed Lower Extremity Assessment Lower Extremity Assessment: RLE deficits/detail RLE Deficits / Details: limited with AAROM for hip exercises due to recent surgery. all LT intact for RLE. grossly 3=/5 for hip moevement     Mobility Bed Mobility Bed Mobility: Supine to Sit Supine to Sit: 4: Min assist;With rails;HOB elevated (with RLE ) Transfers Sit to Stand: 4: Min guard;With upper extremity assist;With armrests;From chair/3-in-1 Stand to Sit: 4: Min assist;With upper extremity assist;To chair/3-in-1 Details for Transfer Assistance: cues for legs and hand placement     Exercise    Balance     End of Session OT - End of Session Activity Tolerance: Patient tolerated treatment well Patient left: in chair;with call bell/phone within reach;with family/visitor present  GO     Beckhem Isadore 06/12/2013, 1:16 PM Marica Otter, OTR/L [phone] 06/12/2013

## 2013-06-12 NOTE — Progress Notes (Signed)
Physical Therapy Treatment Patient Details Name: [name] [name] MRN: [id number] DOB: 08-16-1966 Today's Date: 06/12/2013 Time: 4098-1191 PT Time Calculation (min): 28 min  PT Assessment / Plan / Recommendation  History of Present Illness Pt is s/p R anterior direct THA      PT Comments   *Pt tolerated afternoon session well and pain at a tolerable level.  Just need to educate and perform 3 steps tomorrow . **  Follow Up Recommendations        Does the patient have the potential to tolerate intense rehabilitation     Barriers to Discharge        Equipment Recommendations       Recommendations for Other Services    Frequency     Progress towards PT Goals    Plan      Precautions / Restrictions Precautions Precautions: None Restrictions Other Position/Activity Restrictions: WBAT   Pertinent Vitals/Pain Tolerable , only about 5/10 with ambulation and not any with static sitting.     Mobility  Transfers Transfers: Sit to Stand;Stand to Sit Sit to Stand: 4: Min guard Stand to Sit: 4: Min guard Details for Transfer Assistance: cues for safety, pt self reported and I confirmed accuracy. Ambulation/Gait Ambulation/Gait Assistance: 4: Min guard Ambulation Distance (Feet): 75 Feet Assistive device: Rolling walker Ambulation/Gait Assistance Details: cues for RW sequencing and safety.cues for staying close to RW and how to trun with RW. Gait Pattern: Step-to pattern Gait velocity: slow General Gait Details: pt this afternoon able to lift RLE enough for foot clearance, but not full adequate lift for step through. Stairs: No    Exercises     PT Diagnosis:    PT Problem List:   PT Treatment Interventions:     PT Goals (current goals can now be found in the care plan section)    Visit Information  Last PT Received On: 06/12/13 Assistance Needed: +1 History of Present Illness: Pt is s/p R anterior direct THA    Subjective Data      Cognition   Cognition Arousal/Alertness: Awake/alert Behavior During Therapy: WFL for tasks assessed/performed Overall Cognitive Status: Within Functional Limits for tasks assessed    Balance     End of Session PT - End of Session Activity Tolerance: Patient tolerated treatment well Patient left: in chair;with call bell/phone within reach;with family/visitor present (wife in room) Nurse Communication: Mobility status   GP     Marella Bile 06/12/2013, 2:34 PM Marella Bile, PT Pager: [phone] 06/12/2013

## 2013-06-13 LAB — CBC
HCT: 32.3 % — ABNORMAL LOW (ref 39.0–52.0)
MCH: 29.9 pg (ref 26.0–34.0)
MCV: 88.5 fL (ref 78.0–100.0)
Platelets: 187 10*3/uL (ref 150–400)
RDW: 12.7 % (ref 11.5–15.5)
WBC: 11.2 10*3/uL — ABNORMAL HIGH (ref 4.0–10.5)

## 2013-06-13 NOTE — Progress Notes (Signed)
Subjective: 2 Days Post-Op Procedure(s) (LRB): RIGHT TOTAL HIP ARTHROPLASTY ANTERIOR APPROACH (Right) Awake, alert and oriented x 4. Discussed high impact vs low impact exercises. Needs to avoid arthritis meds except tylenol but also not to take tylenol in excess as narcotics have tylenol also. Patient reports pain as moderate.    Objective:   VITALS:  Temp:  [98 F (36.7 C)-99.2 F (37.3 C)] 98 F (36.7 C) (07/27 0601) Pulse Rate:  [63-90] 63 (07/27 0601) Resp:  [18-20] 18 (07/27 0601) BP: (103-129)/(57-65) 103/62 mmHg (07/27 0601) SpO2:  [95 %-100 %] 96 % (07/27 0601)  Neurologically intact ABD soft Neurovascular intact Sensation intact distally Intact pulses distally Dorsiflexion/Plantar flexion intact Incision: no drainage   LABS  Recent Labs  06/12/13 0528 06/13/13 0444  HGB 12.1* 10.9*  WBC 9.9 11.2*  PLT 218 187    Recent Labs  06/12/13 0528  NA 131*  K 4.4  CL 95*  CO2 28  BUN 13  CREATININE 0.97  GLUCOSE 141*   No results found for this basename: LABPT, INR,  in the last 72 hours   Assessment/Plan: 2 Days Post-Op Procedure(s) (LRB): RIGHT TOTAL HIP ARTHROPLASTY ANTERIOR APPROACH (Right)  Up with therapy Discharge home with home health  NITKA,JAMES E 06/13/2013, 10:23 AM

## 2013-06-13 NOTE — Progress Notes (Signed)
Physical Therapy Treatment Patient Details Name: [name] [name] MRN: [id number] DOB: 1966-01-13 Today's Date: 06/13/2013 Time: 4098-1191 PT Time Calculation (min): 25 min  PT Assessment / Plan / Recommendation  History of Present Illness        PT Comments   *Pt had a lot less pain today making mobility much more easy. Pt with improved hip flexion with gait pattern, and heading to his home with 3 steps to enter , so we reviewed and performed these with ease.Since he has no railings , crutches were issued and performed with crutches and handout given.  Educated and handout given for steps and exercises and how to progress at home. Reviewed car transfer as well. Pt ready for DC from a therapy standpoint today.  Follow Up Recommendations        Does the patient have the potential to tolerate intense rehabilitation     Barriers to Discharge        Equipment Recommendations       Recommendations for Other Services    Frequency     Progress towards PT Goals Progress towards PT goals: Progressing toward goals  Plan      Precautions / Restrictions Precautions Precautions: None Restrictions Weight Bearing Restrictions: No Other Position/Activity Restrictions: WBAT   Pertinent Vitals/Pain "not much at all!!"    Mobility  Bed Mobility Bed Mobility: Supine to Sit Supine to Sit: 4: Min assist;With rails;HOB elevated Transfers Transfers: Sit to Stand;Stand to Sit Sit to Stand: 5: Supervision Stand to Sit: 5: Supervision Ambulation/Gait Ambulation/Gait Assistance: 5: Supervision Ambulation Distance (Feet): 25 Feet (short distance b/c we were just practicing the steps) Assistive device: Rolling walker Ambulation/Gait Assistance Details: he remebered the sequencing and had improved hip flexion on RLE for step through Gait Pattern: Step-to pattern Gait velocity: slow Stairs: Yes Stairs Assistance: 4: Min guard Stair Management Technique: No rails;Forwards;Step to pattern;With  crutches Number of Stairs: 3 (wife present and able to assist with steps at home)    Exercises     PT Diagnosis:    PT Problem List:   PT Treatment Interventions:     PT Goals (current goals can now be found in the care plan section)    Visit Information  Last PT Received On: 06/13/13 Assistance Needed: +1    Subjective Data  Subjective: I want to go home, what do I need to make sure I am ready.   Cognition  Cognition Arousal/Alertness: Awake/alert Behavior During Therapy: WFL for tasks assessed/performed Overall Cognitive Status: Within Functional Limits for tasks assessed    Balance     End of Session PT - End of Session Activity Tolerance: Patient tolerated treatment well Patient left: in bed;with family/visitor present Nurse Communication: Mobility status   GP     Marella Bile 06/13/2013, 12:19 PM Marella Bile, PT Pager: [phone] 06/13/2013

## 2013-06-13 NOTE — Care Management Note (Signed)
[facility] was arranged for home health PT services per patient's choice. Call made to [facility] with [facility] for the referral. Referral accepted. RW and wide 3 in 1 ordered and then delivered by [facility] as well. No further needs assessed. [place], [place] [phone]

## 2013-06-13 NOTE — Discharge Summary (Signed)
Patient ID: [name] [name] MRN: [id number] DOB/AGE: [birth year] 47 y.o.  Admit date: 06/11/2013 Discharge date: 06/13/2013  Admission Diagnoses:  Principal Problem:   Degenerative arthritis of hip, right   Discharge Diagnoses:  Same  Past Medical History  Diagnosis Date  . Hypertension   . Arthritis     SEVERE OA AND PAIN BOTH HIPS     Surgeries: Procedure(s): RIGHT TOTAL HIP ARTHROPLASTY ANTERIOR APPROACH on 06/11/2013   Consultants:    Discharged Condition: Improved  Hospital Course: [name] [name] is an 47 y.o. male who was admitted 06/11/2013 for operative treatment ofDegenerative arthritis of hip. Patient has severe unremitting pain that affects sleep, daily activities, and work/hobbies. After pre-op clearance the patient was taken to the operating room on 06/11/2013 and underwent  Procedure(s): RIGHT TOTAL HIP ARTHROPLASTY ANTERIOR APPROACH.    Patient was given perioperative antibiotics: Anti-infectives   Start     Dose/Rate Route Frequency Ordered Stop   06/11/13 1800  ceFAZolin (ANCEF) IVPB 2 g/50 mL premix     2 g 100 mL/hr over 30 Minutes Intravenous Every 6 hours 06/11/13 1635 06/12/13 0027   06/11/13 0947  ceFAZolin (ANCEF) 3 g in dextrose 5 % 50 mL IVPB     3 g 160 mL/hr over 30 Minutes Intravenous On call to O.R. 06/11/13 0947 06/11/13 1200       Patient was given sequential compression devices, early ambulation, and chemoprophylaxis to prevent DVT.  Patient benefited maximally from hospital stay and there were no complications.    Recent vital signs: Patient Vitals for the past 24 hrs:  BP Temp Temp src Pulse Resp SpO2  06/13/13 0601 103/62 mmHg 98 F (36.7 C) Oral 63 18 96 %  06/12/13 2137 129/65 mmHg 99.2 F (37.3 C) Oral 90 18 97 %     Recent laboratory studies:  Recent Labs  06/12/13 0528 06/13/13 0444  WBC 9.9 11.2*  HGB 12.1* 10.9*  HCT 35.1* 32.3*  PLT 218 187  NA 131*  --   K 4.4  --   CL 95*  --   CO2 28  --   BUN 13  --    CREATININE 0.97  --   GLUCOSE 141*  --   CALCIUM 9.3  --      Discharge Medications:     Medication List    STOP taking these medications       traMADol 50 MG tablet  Commonly known as:  ULTRAM      TAKE these medications       amLODipine 5 MG tablet  Commonly known as:  NORVASC  Take 5 mg by mouth every morning.     aspirin 325 MG EC tablet  Take 1 tablet (325 mg total) by mouth 2 (two) times daily after a meal.     fish oil-omega-3 fatty acids 1000 MG capsule  Take 1 g by mouth daily.     methocarbamol 500 MG tablet  Commonly known as:  ROBAXIN  Take 1 tablet (500 mg total) by mouth every 6 (six) hours as needed (formuscle spasms).     naproxen 500 MG tablet  Commonly known as:  NAPROSYN  Take 500 mg by mouth 2 (two) times daily as needed (for pain).     oxyCODONE-acetaminophen 5-325 MG per tablet  Commonly known as:  ROXICET  Take 1-2 tablets by mouth every 4 (four) hours as needed for pain.        Diagnostic Studies: Dg Hip Complete Right  06/11/2013   *  RADIOLOGY REPORT*  Clinical Data:   Right hip replacement.  RIGHT HIP - COMPLETE 2+ VIEW  Technique:  C-arm fluoroscopic images were obtained intraoperatively and submitted for postoperative interpretation. Please see the performing provider's procedural report for the fluoroscopy time utilized.  Comparison: None.  Findings: There is a right hip arthroplasty.  Visualized pelvic bony structures are intact.  No evidence for a periprosthetic fracture.  IMPRESSION: Right hip replacement without complicating features.   Original Report Authenticated By: Richarda Overlie, M.D.   Dg Pelvis Portable  06/11/2013   *RADIOLOGY REPORT*  Clinical Data: Postop.  PORTABLE PELVIS  Comparison: Cross-table lateral 06/11/2013  Findings: Single view of the pelvis demonstrates a right hip replacement.  The entire femoral prosthesis is identified.  There is joint space narrowing and degenerative changes in the left hip. The pelvic bony ring  is intact.  IMPRESSION: Right hip replacement without complicating features.  Osteoarthritis in the left hip.   Original Report Authenticated By: Richarda Overlie, M.D.   Dg Hip Portable 1 View Right  06/11/2013   *RADIOLOGY REPORT*  Clinical Data: Postop.  PORTABLE RIGHT HIP - 1 VIEW  Comparison: Pelvic radiograph 06/11/2013  Findings: Cross-table lateral view of the right hip was obtained. There is a right hip arthroplasty.  Bone detail is limited but the hip replacement appears to be located.  IMPRESSION: Right hip replacement without complicating features.   Original Report Authenticated By: Richarda Overlie, M.D.   Dg C-arm 61-120 [name]-no Report  06/11/2013   CLINICAL DATA: right anterior hip   C-ARM 61-120 MINUTES  Fluoroscopy was utilized by the requesting physician.  No radiographic  interpretation.     Disposition: 01-Home or Self Care      Discharge Orders   Future Orders Complete By Expires     Call MD / Call 911  As directed     Comments:      If you experience chest pain or shortness of breath, CALL 911 and be transported to the hospital emergency room.  If you develope a fever above 101 F, pus (white drainage) or increased drainage or redness at the wound, or calf pain, call your surgeon's office.    Call MD / Call 911  As directed     Comments:      If you experience chest pain or shortness of breath, CALL 911 and be transported to the hospital emergency room.  If you develope a fever above 101 F, pus (white drainage) or increased drainage or redness at the wound, or calf pain, call your surgeon's office.    Constipation Prevention  As directed     Comments:      Drink plenty of fluids.  Prune juice may be helpful.  You may use a stool softener, such as Colace (over the counter) 100 mg twice a day.  Use MiraLax (over the counter) for constipation as needed.    Constipation Prevention  As directed     Comments:      Drink plenty of fluids.  Prune juice may be helpful.  You may use a stool  softener, such as Colace (over the counter) 100 mg twice a day.  Use MiraLax (over the counter) for constipation as needed.    Diet - low sodium heart healthy  As directed     Diet - low sodium heart healthy  As directed     Discharge instructions  As directed     Comments:      Increase your activities as  comfort allows. Expect right thigh swelling. You can get your current dressing wet in the shower. Leave your current dressing on for one week; then you may remove it next Friday and get your actual incision wet in the shower daily at that point. When the current dressing is removed, place a new dry dressing daily    Discharge instructions  As directed     Comments:      Keep hip incision dry for 5 days post op then may wet while bathing. Therapy daily . Call if fever or chills or increased drainage. Go to ER if acutely short of breath or call for ambulance. Return for follow up in 2 weeks. May full weight bear on the surgical leg unless told otherwise. In house walking for first 2 weeks.    Driving restrictions  As directed     Comments:      No driving for 4 weeks    Increase activity slowly as tolerated  As directed     Increase activity slowly as tolerated  As directed     Lifting restrictions  As directed     Comments:      No lifting for 6 weeks       Follow-up Information   Follow up with Kathryne Hitch, MD In 2 weeks.   Contact information:   [address] [place] [address] [phone]       Follow up with [facility]. [facility] and equipment)    Contact information:   [phone]       Signed: Kathryne Hitch 06/13/2013, 4:44 PM

## 2013-06-13 NOTE — Plan of Care (Signed)
Problem: Discharge Progression Outcomes Goal: Anticoagulant follow-up in place Outcome: Not Applicable Date Met:  06/13/13 asa     

## 2013-06-13 NOTE — Progress Notes (Signed)
Discharged from floor via w/c, wife with pt. No changes in assessment. [name] [name]  

## 2013-06-14 ENCOUNTER — Encounter (HOSPITAL_COMMUNITY): Payer: Self-pay | Admitting: Orthopaedic Surgery

## 2013-06-28 ENCOUNTER — Ambulatory Visit (HOSPITAL_COMMUNITY)
Admission: RE | Admit: 2013-06-28 | Discharge: 2013-06-28 | Disposition: A | Discharge status: Home / Self Care | Payer: BC Managed Care – PPO | Source: Ambulatory Visit | Attending: Orthopaedic Surgery | Admitting: Orthopaedic Surgery

## 2013-06-28 DIAGNOSIS — R269 Unspecified abnormalities of gait and mobility: Secondary | ICD-10-CM | POA: Insufficient documentation

## 2013-06-28 DIAGNOSIS — R29898 Other symptoms and signs involving the musculoskeletal system: Secondary | ICD-10-CM | POA: Insufficient documentation

## 2013-06-28 DIAGNOSIS — M25559 Pain in unspecified hip: Secondary | ICD-10-CM | POA: Insufficient documentation

## 2013-06-28 DIAGNOSIS — R262 Difficulty in walking, not elsewhere classified: Secondary | ICD-10-CM | POA: Insufficient documentation

## 2013-06-28 DIAGNOSIS — IMO0001 Reserved for inherently not codable concepts without codable children: Secondary | ICD-10-CM | POA: Insufficient documentation

## 2013-06-28 NOTE — Evaluation (Signed)
Physical Therapy Evaluation  Patient Details  Name: [name] [name] MRN: [id number] Date of Birth: 1966/04/29  Today's Date: 06/28/2013 Time: 1300-1355 PT Time Calculation (min): 55 min Charge:  Evaluation; there ex  1335-1355             Visit#: 1 of 12  Re-eval: 07/28/13 Assessment Diagnosis: R THR Surgical Date: 06/11/13  Authorization: BCBS    Past Medical History:  Past Medical History  Diagnosis Date  . Hypertension   . Arthritis     SEVERE OA AND PAIN BOTH HIPS    Past Surgical History:  Past Surgical History  Procedure Laterality Date  . Total hip arthroplasty Right 06/11/2013    Procedure: RIGHT TOTAL HIP ARTHROPLASTY ANTERIOR APPROACH;  Surgeon: Kathryne Hitch, MD;  Location: [facility];  Service: Orthopedics;  Laterality: Right;    Subjective Symptoms/Limitations Symptoms: Pt states that he had been having pain in both of his hips for years.  He opted to have a R THR on 7/25 and was discharged on 06/13/2013.  Mr. States states that the pain had gotten so severe that one month  prior to the surgery he began using a cane to ambulate with.  Mr Ancrum hs currently been referred to therapy to improve his strengh, improve his pain and return hiim to his maximal functional ability. How long can you sit comfortably?: no problem;  Difficulty getting from sit to stand however. How long can you stand comfortably?: less than 10 minutes How long can you walk comfortably?: less than 15 minues Pain Assessment Currently in Pain?: Yes Pain Score: 2  Pain Location: Hip Pain Orientation: Right Pain Type: Surgical pain Pain Onset: 1 to 4 weeks ago Pain Frequency: Intermittent Balance Screening  no falls  Prior Functioning  Prior Function Vocation: Full time employment Vocation Requirements: runs a dump truck Leisure: Hobbies-yes (Comment) Comments: working out; walking  Assessment RLE Strength Right Hip Flexion: 2-/5 Right Hip Extension: 3/5 Right Hip ABduction:  3-/5 Right Hip ADduction: 2/5  Exercise/Treatments Mobility/Balance  Ambulation/Gait Gait Pattern: Antalgic    Aerobic Stationary Bike: Nustep L3 x 7' Standing Wall Squat: 5 reps SLS:  (x3; ) Other Standing Knee Exercises: hip adduction x 10 Supine Straight Leg Raises: 5 reps;Limitations Straight Leg Raises Limitations: knee bent to 90 degrees Sidelying Hip ABduction: 5 reps Prone  Hip Extension: 5 reps     Physical Therapy Assessment and Plan PT Assessment and Plan Clinical Impression Statement: Pt s/p THR with decreased strength, decreased pain, decreased balance who can benefit from skilled PT to return to maximal functional level. Rehab Potential: Good PT Frequency: Min 3X/week PT Duration: 4 weeks PT Treatment/Interventions: Gait training;DME instruction;Stair training PT Plan: begin gait trainer next treatement; LL 94 cm; begin rockerboard, minisqut; heel raise, rocker board, forward and lateral step ups  progress as indicated    Goals Home Exercise Program Pt/caregiver will Perform Home Exercise Program: For increased strengthening PT Short Term Goals Time to Complete Short Term Goals: 2 weeks PT Short Term Goal 1: Pt to stength to be increased by one level to allow pt to ambulate without a cane PT Short Term Goal 2: Pt pain to be no higher than a 1/10 PT Long Term Goals Time to Complete Long Term Goals: 4 weeks PT Long Term Goal 1: Pt strength to be improved to 5-/5 to allow pt to step up a 24" step holding onto handgrip for work duty PT Long Term Goal 2: Pt to have no pain Long Term  Goal 3: Pt to be able to walk for 40 minutes for a healthy lifestyle Long Term Goal 4: Pt to be back working out at the gym. PT Long Term Goal 5: Pt to have a normalized gait pattern.  Problem List Patient Active Problem List   Diagnosis Date Noted  . Right leg weakness 06/28/2013  . Difficulty in walking(719.7) 06/28/2013  . Gait disturbance 06/28/2013  . Pain in joint,  other specified sites 06/28/2013  . Degenerative arthritis of hip, right 06/11/2013       GP    RUSSELL,CINDY 06/28/2013, 5:22 PM  Physician Documentation Your signature is required to indicate approval of the treatment plan as stated above.  Please sign and either send electronically or make a copy of this report for your files and return this physician signed original.   Please mark one 1.__approve of plan  2. ___approve of plan with the following conditions.   ______________________________                                                          _____________________ Physician Signature                                                                                                             Date

## 2013-06-30 ENCOUNTER — Ambulatory Visit (HOSPITAL_COMMUNITY)
Admission: RE | Admit: 2013-06-30 | Discharge: 2013-06-30 | Disposition: A | Discharge status: Home / Self Care | Payer: BC Managed Care – PPO | Source: Ambulatory Visit | Attending: Family Medicine | Admitting: Family Medicine

## 2013-06-30 NOTE — Progress Notes (Signed)
Physical Therapy Treatment Patient Details  Name: [name] [name] MRN: [id number] Date of Birth: 1966-02-05  Today's Date: 06/30/2013 Time: 4098-1191 PT Time Calculation (min): 41 min  Visit#: 2 of 12  Re-eval: 07/28/13 Charges: Therex x 47'(8295-6213) Gait x 10'(1335-1345)   Authorization: BCBS    Subjective: Symptoms/Limitations Symptoms: Pt reprots HEP compliance. Pain Assessment Currently in Pain?: No/denies  Precautions/Restrictions     Exercise/Treatments Aerobic Tread Mill: Gait training .45 cyle 94 leg length to equalize step length and improve posture Machines for Strengthening Cybex Knee Extension: 2pl RLE only x 10 Cybex Knee Flexion: 5pl x 10 RLE only Standing Heel Raises: 10 reps;Limitations Heel Raises Limitations: Toe raises x 10 Lateral Step Up: 10 reps;Step Height: 4";Hand Hold: 1;Right Forward Step Up: 10 reps;Step Height: 4";Hand Hold: 1;Right Functional Squat: 10 reps;5 seconds Wall Squat: 10 reps;5 seconds Rocker Board: 2 minutes;Limitations Rocker Board Limitations: R/L SLS: RLE 23" max of 3  Physical Therapy Assessment and Plan PT Assessment and Plan Clinical Impression Statement: Pt tolerates progression of strengthening exercises well. Began gait training on treadmill to improve gait mechanics. Pt requires multimodal cueing with wall squats to improve form and VCs with cybex machines to decrease speed and improve control.  Rehab Potential: Good PT Frequency: Min 3X/week PT Duration: 4 weeks PT Treatment/Interventions: Gait training;DME instruction;Stair training PT Plan: Continue to progress strength, stability and gait quality.     Problem List Patient Active Problem List   Diagnosis Date Noted  . Right leg weakness 06/28/2013  . Difficulty in walking(719.7) 06/28/2013  . Gait disturbance 06/28/2013  . Pain in joint, other specified sites 06/28/2013  . Degenerative arthritis of hip, right 06/11/2013    PT - End of Session Activity  Tolerance: Patient tolerated treatment well General Behavior During Therapy: [facility] for tasks assessed/performed  Seth Bake, PTA  06/30/2013, 4:35 PM

## 2013-10-04 ENCOUNTER — Other Ambulatory Visit (HOSPITAL_COMMUNITY): Payer: Self-pay | Admitting: Internal Medicine

## 2013-10-04 DIAGNOSIS — N63 Unspecified lump in unspecified breast: Secondary | ICD-10-CM

## 2013-10-06 ENCOUNTER — Encounter (HOSPITAL_COMMUNITY): Payer: Self-pay | Admitting: Pharmacy Technician

## 2013-10-11 ENCOUNTER — Encounter (HOSPITAL_COMMUNITY): Payer: Self-pay

## 2013-10-11 ENCOUNTER — Encounter (INDEPENDENT_AMBULATORY_CARE_PROVIDER_SITE_OTHER): Payer: Self-pay

## 2013-10-11 ENCOUNTER — Other Ambulatory Visit (HOSPITAL_COMMUNITY): Payer: Self-pay | Admitting: Orthopaedic Surgery

## 2013-10-11 ENCOUNTER — Encounter (HOSPITAL_COMMUNITY)
Admission: RE | Admit: 2013-10-11 | Discharge: 2013-10-11 | Disposition: A | Discharge status: Home / Self Care | Payer: BC Managed Care – PPO | Source: Ambulatory Visit | Attending: Orthopaedic Surgery | Admitting: Orthopaedic Surgery

## 2013-10-11 DIAGNOSIS — Z01818 Encounter for other preprocedural examination: Secondary | ICD-10-CM | POA: Insufficient documentation

## 2013-10-11 DIAGNOSIS — Z01812 Encounter for preprocedural laboratory examination: Secondary | ICD-10-CM | POA: Insufficient documentation

## 2013-10-11 HISTORY — DX: Pure hypercholesterolemia, unspecified: E78.00

## 2013-10-11 HISTORY — DX: Other specified abnormal findings of blood chemistry: R79.89

## 2013-10-11 LAB — URINALYSIS, ROUTINE W REFLEX MICROSCOPIC
Bilirubin Urine: NEGATIVE
Glucose, UA: NEGATIVE mg/dL
Hgb urine dipstick: NEGATIVE
Ketones, ur: NEGATIVE mg/dL
Protein, ur: NEGATIVE mg/dL
Urobilinogen, UA: 0.2 mg/dL (ref 0.0–1.0)

## 2013-10-11 LAB — CBC
HCT: 41.9 % (ref 39.0–52.0)
Hemoglobin: 14.6 g/dL (ref 13.0–17.0)
MCH: 29.9 pg (ref 26.0–34.0)
MCHC: 34.8 g/dL (ref 30.0–36.0)
RDW: 12.5 % (ref 11.5–15.5)

## 2013-10-11 LAB — BASIC METABOLIC PANEL
GFR calc Af Amer: >90 mL/min (ref >90)
GFR calc non Af Amer: 84 mL/min — ABNORMAL LOW (ref >90)
Potassium: 4 mEq/L (ref 3.5–5.1)
Sodium: 136 mEq/L (ref 135–145)

## 2013-10-11 LAB — PROTIME-INR
INR: 1.03 (ref 0.00–1.49)
Prothrombin Time: 13.3 seconds (ref 11.6–15.2)

## 2013-10-11 LAB — APTT: aPTT: 30 seconds (ref 24–37)

## 2013-10-11 NOTE — Progress Notes (Signed)
EKG 04/23/13 on EPIC, Chest x-ray 04/23/13 on EPIC

## 2013-10-11 NOTE — Progress Notes (Signed)
10/11/13 1345  OBSTRUCTIVE SLEEP APNEA  Have you ever been diagnosed with sleep apnea through a sleep study? No  Do you snore loudly (loud enough to be heard through closed doors)?  1  Do you often feel tired, fatigued, or sleepy during the daytime? 0  Has anyone observed you stop breathing during your sleep? 0  Do you have, or are you being treated for high blood pressure? 1  BMI more than 35 kg/m2? 1  Age over 49 years old? 0  Neck circumference greater than 40 cm/18 inches? 1  Gender: 1  Obstructive Sleep Apnea Score 5  Score 4 or greater  Results sent to PCP

## 2013-10-11 NOTE — Progress Notes (Signed)
Please write pre-op orders. Pt is having surgery 10/22/13 and pre-op 10/11/13 1330pm.

## 2013-10-11 NOTE — Patient Instructions (Signed)
[address] E[address]  10/11/2013   Your procedure is scheduled on: 10/22/13  Report to [facility] at 12:15 PM.  Call this number if you have problems the morning of surgery 336-: [phone]   Remember:   Do not eat food After Midnight, clear liquids from midnight until 8:40am on 10/22/13 then nothing.      Take these medicines the morning of surgery with A SIP OF WATER: amlodipine   Do not wear jewelry, make-up or nail polish.  Do not wear lotions, powders, or perfumes. You may wear deodorant.  Do not shave 48 hours prior to surgery. Men may shave face and neck.  Do not bring valuables to the hospital.  Contacts, dentures or bridgework may not be worn into surgery.  Leave suitcase in the car. After surgery it may be brought to your room.  For patients admitted to the hospital, checkout time is 11:00 AM the day of discharge.    Please read over the following fact sheets that you were given: MRSA Information, blood fact sheet, clear liquids fact sheet.  Birdie Sons, RN  pre op nurse call if needed [phone]    FAILURE TO FOLLOW THESE INSTRUCTIONS MAY RESULT IN CANCELLATION OF YOUR SURGERY   Patient Signature: ___________________________________________

## 2013-10-18 NOTE — Progress Notes (Signed)
Surgery time change from 1440 to 1210 on 10/22/13. Pt called and agreed to arrive at short stay at 9:45 am. Pt asked about medications to take, reinforced the instructions. No other questions or concerns.

## 2013-10-20 ENCOUNTER — Ambulatory Visit (HOSPITAL_COMMUNITY)
Admission: RE | Admit: 2013-10-20 | Discharge: 2013-10-20 | Disposition: A | Discharge status: Home / Self Care | Payer: BC Managed Care – PPO | Source: Ambulatory Visit | Attending: Internal Medicine | Admitting: Internal Medicine

## 2013-10-20 ENCOUNTER — Other Ambulatory Visit (HOSPITAL_COMMUNITY): Payer: Self-pay | Admitting: Internal Medicine

## 2013-10-20 DIAGNOSIS — N63 Unspecified lump in unspecified breast: Secondary | ICD-10-CM

## 2013-10-22 ENCOUNTER — Encounter (HOSPITAL_COMMUNITY): Payer: Self-pay | Admitting: *Deleted

## 2013-10-22 ENCOUNTER — Inpatient Hospital Stay (HOSPITAL_COMMUNITY): Payer: BC Managed Care – PPO

## 2013-10-22 ENCOUNTER — Encounter (HOSPITAL_COMMUNITY): Payer: BC Managed Care – PPO | Admitting: Certified Registered Nurse Anesthetist

## 2013-10-22 ENCOUNTER — Inpatient Hospital Stay (HOSPITAL_COMMUNITY)
Admission: RE | Admit: 2013-10-22 | Discharge: 2013-10-24 | DRG: 470 | Disposition: A | Discharge status: Home Health | Payer: BC Managed Care – PPO | Source: Ambulatory Visit | Attending: Orthopaedic Surgery | Admitting: Orthopaedic Surgery

## 2013-10-22 ENCOUNTER — Inpatient Hospital Stay (HOSPITAL_COMMUNITY): Payer: BC Managed Care – PPO | Admitting: Certified Registered Nurse Anesthetist

## 2013-10-22 ENCOUNTER — Encounter (HOSPITAL_COMMUNITY)
Admission: RE | Disposition: A | Discharge status: Home Health | Payer: Self-pay | Source: Ambulatory Visit | Attending: Orthopaedic Surgery

## 2013-10-22 DIAGNOSIS — Z6841 Body Mass Index (BMI) 40.0 and over, adult: Secondary | ICD-10-CM

## 2013-10-22 DIAGNOSIS — E7800 Pure hypercholesterolemia, unspecified: Secondary | ICD-10-CM | POA: Diagnosis present

## 2013-10-22 DIAGNOSIS — I10 Essential (primary) hypertension: Secondary | ICD-10-CM | POA: Diagnosis present

## 2013-10-22 DIAGNOSIS — Z87891 Personal history of nicotine dependence: Secondary | ICD-10-CM

## 2013-10-22 DIAGNOSIS — M1610 Unilateral primary osteoarthritis, unspecified hip: Principal | ICD-10-CM

## 2013-10-22 DIAGNOSIS — Z01812 Encounter for preprocedural laboratory examination: Secondary | ICD-10-CM

## 2013-10-22 DIAGNOSIS — M169 Osteoarthritis of hip, unspecified: Principal | ICD-10-CM | POA: Diagnosis present

## 2013-10-22 DIAGNOSIS — Z96649 Presence of unspecified artificial hip joint: Secondary | ICD-10-CM

## 2013-10-22 DIAGNOSIS — R29898 Other symptoms and signs involving the musculoskeletal system: Secondary | ICD-10-CM | POA: Diagnosis present

## 2013-10-22 HISTORY — PX: TOTAL HIP ARTHROPLASTY: SHX124

## 2013-10-22 LAB — TYPE AND SCREEN: ABO/RH(D): A POS

## 2013-10-22 SURGERY — ARTHROPLASTY, HIP, TOTAL, ANTERIOR APPROACH
Anesthesia: General | Site: Hip | Laterality: Left

## 2013-10-22 MED ORDER — DOCUSATE SODIUM 100 MG PO CAPS
100.0000 mg | ORAL_CAPSULE | Freq: Two times a day (BID) | ORAL | Status: DC
  Administered 2013-10-22 – 2013-10-24 (×4): 100 mg via ORAL

## 2013-10-22 MED ORDER — GLYCOPYRROLATE 0.2 MG/ML IJ SOLN
INTRAMUSCULAR | Status: DC | PRN
  Administered 2013-10-22: 0.6 mg via INTRAVENOUS

## 2013-10-22 MED ORDER — ACETAMINOPHEN 650 MG RE SUPP: 650.0000 mg | Freq: Four times a day (QID) | RECTAL | Status: DC | PRN

## 2013-10-22 MED ORDER — SODIUM CHLORIDE 0.9 % IR SOLN
Status: DC | PRN
  Administered 2013-10-22: 1

## 2013-10-22 MED ORDER — ALUM & MAG HYDROXIDE-SIMETH 200-200-20 MG/5ML PO SUSP: 30.0000 mL | ORAL | Status: DC | PRN

## 2013-10-22 MED ORDER — METHOCARBAMOL 100 MG/ML IJ SOLN
500.0000 mg | Freq: Four times a day (QID) | INTRAVENOUS | Status: DC | PRN
  Administered 2013-10-22: 500 mg via INTRAVENOUS
  Filled 2013-10-22: qty 5

## 2013-10-22 MED ORDER — SIMVASTATIN 5 MG PO TABS
5.0000 mg | ORAL_TABLET | Freq: Every day | ORAL | Status: DC
  Administered 2013-10-22 – 2013-10-23 (×2): 5 mg via ORAL
  Filled 2013-10-22 (×3): qty 1

## 2013-10-22 MED ORDER — SODIUM CHLORIDE 0.9 % IV SOLN
INTRAVENOUS | Status: DC
  Administered 2013-10-22: 16:00:00 via INTRAVENOUS

## 2013-10-22 MED ORDER — EPHEDRINE SULFATE 50 MG/ML IJ SOLN
INTRAMUSCULAR | Status: AC
  Filled 2013-10-22: qty 1

## 2013-10-22 MED ORDER — MIDAZOLAM HCL 2 MG/2ML IJ SOLN
INTRAMUSCULAR | Status: AC
  Filled 2013-10-22: qty 2

## 2013-10-22 MED ORDER — ACETAMINOPHEN 325 MG PO TABS: 650.0000 mg | ORAL_TABLET | Freq: Four times a day (QID) | ORAL | Status: DC | PRN

## 2013-10-22 MED ORDER — LACTATED RINGERS IV SOLN: INTRAVENOUS | Status: DC

## 2013-10-22 MED ORDER — GLYCOPYRROLATE 0.2 MG/ML IJ SOLN
INTRAMUSCULAR | Status: AC
  Filled 2013-10-22: qty 1

## 2013-10-22 MED ORDER — DEXAMETHASONE SODIUM PHOSPHATE 10 MG/ML IJ SOLN
INTRAMUSCULAR | Status: AC
  Filled 2013-10-22: qty 3

## 2013-10-22 MED ORDER — SUCCINYLCHOLINE CHLORIDE 20 MG/ML IJ SOLN
INTRAMUSCULAR | Status: AC
  Filled 2013-10-22: qty 1

## 2013-10-22 MED ORDER — PROPOFOL 10 MG/ML IV BOLUS
INTRAVENOUS | Status: AC
  Filled 2013-10-22: qty 20

## 2013-10-22 MED ORDER — HYDROMORPHONE HCL PF 1 MG/ML IJ SOLN: 1.0000 mg | INTRAMUSCULAR | Status: DC | PRN

## 2013-10-22 MED ORDER — OXYCODONE HCL ER 20 MG PO T12A
20.0000 mg | EXTENDED_RELEASE_TABLET | Freq: Two times a day (BID) | ORAL | Status: DC
  Administered 2013-10-22 – 2013-10-24 (×3): 20 mg via ORAL
  Filled 2013-10-22 (×4): qty 1

## 2013-10-22 MED ORDER — MENTHOL 3 MG MT LOZG: 1.0000 | LOZENGE | OROMUCOSAL | Status: DC | PRN

## 2013-10-22 MED ORDER — PROPOFOL 10 MG/ML IV BOLUS
INTRAVENOUS | Status: DC | PRN
  Administered 2013-10-22: 200 mg via INTRAVENOUS
  Administered 2013-10-22: 90 mg via INTRAVENOUS

## 2013-10-22 MED ORDER — FENTANYL CITRATE 0.05 MG/ML IJ SOLN
INTRAMUSCULAR | Status: DC | PRN
  Administered 2013-10-22 (×2): 50 ug via INTRAVENOUS
  Administered 2013-10-22: 100 ug via INTRAVENOUS
  Administered 2013-10-22: 50 ug via INTRAVENOUS

## 2013-10-22 MED ORDER — LIDOCAINE HCL (CARDIAC) 20 MG/ML IV SOLN
INTRAVENOUS | Status: DC | PRN
  Administered 2013-10-22: 100 mg via INTRAVENOUS

## 2013-10-22 MED ORDER — HYDROMORPHONE HCL PF 1 MG/ML IJ SOLN
INTRAMUSCULAR | Status: AC
  Filled 2013-10-22: qty 1

## 2013-10-22 MED ORDER — TRANEXAMIC ACID 100 MG/ML IV SOLN
1000.0000 mg | INTRAVENOUS | Status: AC
  Administered 2013-10-22: 1000 mg via INTRAVENOUS
  Filled 2013-10-22: qty 10

## 2013-10-22 MED ORDER — ACETAMINOPHEN 10 MG/ML IV SOLN
1000.0000 mg | Freq: Once | INTRAVENOUS | Status: AC
  Administered 2013-10-22: 1000 mg via INTRAVENOUS
  Filled 2013-10-22: qty 100

## 2013-10-22 MED ORDER — EPHEDRINE SULFATE 50 MG/ML IJ SOLN
INTRAMUSCULAR | Status: DC | PRN
  Administered 2013-10-22 (×2): 10 mg via INTRAVENOUS

## 2013-10-22 MED ORDER — DEXTROSE 5 % IV SOLN
3.0000 g | INTRAVENOUS | Status: AC
  Administered 2013-10-22: 3 g via INTRAVENOUS
  Filled 2013-10-22: qty 3000

## 2013-10-22 MED ORDER — POLYETHYLENE GLYCOL 3350 17 G PO PACK
17.0000 g | PACK | Freq: Every day | ORAL | Status: DC | PRN
  Administered 2013-10-24: 10:00:00 17 g via ORAL

## 2013-10-22 MED ORDER — HYDROMORPHONE HCL PF 1 MG/ML IJ SOLN
0.2500 mg | INTRAMUSCULAR | Status: DC | PRN
  Administered 2013-10-22 (×2): 0.25 mg via INTRAVENOUS

## 2013-10-22 MED ORDER — CEFAZOLIN SODIUM-DEXTROSE 2-3 GM-% IV SOLR
2.0000 g | Freq: Four times a day (QID) | INTRAVENOUS | Status: AC
  Administered 2013-10-22 (×2): 2 g via INTRAVENOUS
  Filled 2013-10-22 (×2): qty 50

## 2013-10-22 MED ORDER — NEOSTIGMINE METHYLSULFATE 1 MG/ML IJ SOLN
INTRAMUSCULAR | Status: DC | PRN
  Administered 2013-10-22: 5 mg via INTRAVENOUS

## 2013-10-22 MED ORDER — SODIUM CHLORIDE 0.9 % IJ SOLN
INTRAMUSCULAR | Status: AC
  Filled 2013-10-22: qty 10

## 2013-10-22 MED ORDER — ASPIRIN EC 325 MG PO TBEC
325.0000 mg | DELAYED_RELEASE_TABLET | Freq: Two times a day (BID) | ORAL | Status: DC
  Administered 2013-10-22 – 2013-10-24 (×4): 325 mg via ORAL
  Filled 2013-10-22 (×6): qty 1

## 2013-10-22 MED ORDER — OXYCODONE HCL 5 MG PO TABS
5.0000 mg | ORAL_TABLET | ORAL | Status: DC | PRN
  Administered 2013-10-22 – 2013-10-24 (×6): 10 mg via ORAL
  Filled 2013-10-22 (×6): qty 2

## 2013-10-22 MED ORDER — NEOSTIGMINE METHYLSULFATE 1 MG/ML IJ SOLN
INTRAMUSCULAR | Status: AC
  Filled 2013-10-22: qty 10

## 2013-10-22 MED ORDER — DIPHENHYDRAMINE HCL 12.5 MG/5ML PO ELIX: 12.5000 mg | ORAL_SOLUTION | ORAL | Status: DC | PRN

## 2013-10-22 MED ORDER — LACTATED RINGERS IV SOLN
INTRAVENOUS | Status: DC
  Administered 2013-10-22: 13:00:00 via INTRAVENOUS
  Administered 2013-10-22: 1000 mL via INTRAVENOUS

## 2013-10-22 MED ORDER — ROCURONIUM BROMIDE 100 MG/10ML IV SOLN
INTRAVENOUS | Status: AC
  Filled 2013-10-22: qty 1

## 2013-10-22 MED ORDER — FENTANYL CITRATE 0.05 MG/ML IJ SOLN
INTRAMUSCULAR | Status: AC
  Filled 2013-10-22: qty 5

## 2013-10-22 MED ORDER — PHENOL 1.4 % MT LIQD: 1.0000 | OROMUCOSAL | Status: DC | PRN

## 2013-10-22 MED ORDER — ONDANSETRON HCL 4 MG PO TABS: 4.0000 mg | ORAL_TABLET | Freq: Four times a day (QID) | ORAL | Status: DC | PRN

## 2013-10-22 MED ORDER — ZOLPIDEM TARTRATE 5 MG PO TABS: 5.0000 mg | ORAL_TABLET | Freq: Every evening | ORAL | Status: DC | PRN

## 2013-10-22 MED ORDER — 0.9 % SODIUM CHLORIDE (POUR BTL) OPTIME
TOPICAL | Status: DC | PRN
  Administered 2013-10-22: 1000 mL

## 2013-10-22 MED ORDER — ONDANSETRON HCL 4 MG/2ML IJ SOLN: 4.0000 mg | Freq: Four times a day (QID) | INTRAMUSCULAR | Status: DC | PRN

## 2013-10-22 MED ORDER — MIDAZOLAM HCL 5 MG/5ML IJ SOLN
INTRAMUSCULAR | Status: DC | PRN
  Administered 2013-10-22: 2 mg via INTRAVENOUS

## 2013-10-22 MED ORDER — DEXAMETHASONE SODIUM PHOSPHATE 10 MG/ML IJ SOLN
INTRAMUSCULAR | Status: DC | PRN
  Administered 2013-10-22: 10 mg via INTRAVENOUS

## 2013-10-22 MED ORDER — AMLODIPINE BESYLATE 5 MG PO TABS
5.0000 mg | ORAL_TABLET | Freq: Every morning | ORAL | Status: DC
  Administered 2013-10-23 – 2013-10-24 (×2): 5 mg via ORAL
  Filled 2013-10-22 (×2): qty 1

## 2013-10-22 MED ORDER — METHOCARBAMOL 500 MG PO TABS
500.0000 mg | ORAL_TABLET | Freq: Four times a day (QID) | ORAL | Status: DC | PRN
  Administered 2013-10-24: 500 mg via ORAL
  Filled 2013-10-22: qty 1

## 2013-10-22 MED ORDER — KETAMINE HCL 10 MG/ML IJ SOLN
INTRAMUSCULAR | Status: DC | PRN
  Administered 2013-10-22: 50 mg via INTRAVENOUS

## 2013-10-22 MED ORDER — HYDROMORPHONE HCL PF 1 MG/ML IJ SOLN
INTRAMUSCULAR | Status: DC | PRN
  Administered 2013-10-22 (×2): 0.5 mg via INTRAVENOUS

## 2013-10-22 MED ORDER — SUCCINYLCHOLINE CHLORIDE 20 MG/ML IJ SOLN
INTRAMUSCULAR | Status: DC | PRN
  Administered 2013-10-22 (×2): 100 mg via INTRAVENOUS

## 2013-10-22 MED ORDER — METOCLOPRAMIDE HCL 5 MG/ML IJ SOLN: 5.0000 mg | Freq: Three times a day (TID) | INTRAMUSCULAR | Status: DC | PRN

## 2013-10-22 MED ORDER — ROCURONIUM BROMIDE 100 MG/10ML IV SOLN
INTRAVENOUS | Status: DC | PRN
  Administered 2013-10-22: 50 mg via INTRAVENOUS

## 2013-10-22 MED ORDER — METOCLOPRAMIDE HCL 10 MG PO TABS: 5.0000 mg | ORAL_TABLET | Freq: Three times a day (TID) | ORAL | Status: DC | PRN

## 2013-10-22 MED ORDER — LIDOCAINE HCL (CARDIAC) 20 MG/ML IV SOLN
INTRAVENOUS | Status: AC
  Filled 2013-10-22: qty 5

## 2013-10-22 MED ORDER — ONDANSETRON HCL 4 MG/2ML IJ SOLN
INTRAMUSCULAR | Status: DC | PRN
  Administered 2013-10-22: 4 mg via INTRAVENOUS

## 2013-10-22 MED ORDER — HYDROMORPHONE HCL PF 2 MG/ML IJ SOLN
INTRAMUSCULAR | Status: AC
  Filled 2013-10-22: qty 1

## 2013-10-22 SURGICAL SUPPLY — 39 items
APL SKNCLS STERI-STRIP NONHPOA (GAUZE/BANDAGES/DRESSINGS) ×1
BAG SPEC THK2 15X12 ZIP CLS (MISCELLANEOUS) ×1
BAG ZIPLOCK 12X15 (MISCELLANEOUS) ×1 IMPLANT
BENZOIN TINCTURE PRP APPL 2/3 (GAUZE/BANDAGES/DRESSINGS) ×1 IMPLANT
BLADE SAW SGTL 18X1.27X75 (BLADE) ×2 IMPLANT
CAPT HIP PF COP ×1 IMPLANT
CELLS DAT CNTRL 66122 CELL SVR (MISCELLANEOUS) ×1 IMPLANT
DRAPE C-ARM 42X120 X-RAY (DRAPES) ×2 IMPLANT
DRAPE STERI IOBAN 125X83 (DRAPES) ×2 IMPLANT
DRAPE U-SHAPE 47X51 STRL (DRAPES) ×6 IMPLANT
DRSG AQUACEL AG ADV 3.5X10 (GAUZE/BANDAGES/DRESSINGS) ×2 IMPLANT
DURAPREP 26ML APPLICATOR (WOUND CARE) ×2 IMPLANT
ELECT BLADE TIP CTD 4 INCH (ELECTRODE) ×2 IMPLANT
ELECT REM PT RETURN 9FT ADLT (ELECTROSURGICAL) ×2
ELECTRODE REM PT RTRN 9FT ADLT (ELECTROSURGICAL) ×1 IMPLANT
FACESHIELD LNG OPTICON STERILE (SAFETY) ×8 IMPLANT
GAUZE XEROFORM 1X8 LF (GAUZE/BANDAGES/DRESSINGS) ×1 IMPLANT
GLOVE BIO SURGEON STRL SZ7.5 (GLOVE) ×2 IMPLANT
GLOVE BIOGEL PI IND STRL 8 (GLOVE) ×2 IMPLANT
GLOVE BIOGEL PI INDICATOR 8 (GLOVE) ×2
GLOVE ECLIPSE 8.0 STRL XLNG CF (GLOVE) ×2 IMPLANT
GOWN STRL REIN XL XLG (GOWN DISPOSABLE) ×4 IMPLANT
HANDPIECE INTERPULSE COAX TIP (DISPOSABLE) ×2
KIT BASIN OR (CUSTOM PROCEDURE TRAY) ×2 IMPLANT
PACK TOTAL JOINT (CUSTOM PROCEDURE TRAY) ×2 IMPLANT
PADDING CAST COTTON 6X4 STRL (CAST SUPPLIES) ×2 IMPLANT
RTRCTR WOUND ALEXIS 18CM MED (MISCELLANEOUS) ×2
SET HNDPC FAN SPRY TIP SCT (DISPOSABLE) ×1 IMPLANT
STRIP CLOSURE SKIN 1/2X4 (GAUZE/BANDAGES/DRESSINGS) ×1 IMPLANT
SUT ETHIBOND NAB CT1 #1 30IN (SUTURE) ×2 IMPLANT
SUT ETHILON 3 0 PS 1 (SUTURE) IMPLANT
SUT MNCRL AB 4-0 PS2 18 (SUTURE) ×1 IMPLANT
SUT VIC AB 0 CT1 36 (SUTURE) ×3 IMPLANT
SUT VIC AB 1 CT1 36 (SUTURE) ×2 IMPLANT
SUT VIC AB 2-0 CT1 27 (SUTURE) ×2
SUT VIC AB 2-0 CT1 TAPERPNT 27 (SUTURE) ×1 IMPLANT
TOWEL OR 17X26 10 PK STRL BLUE (TOWEL DISPOSABLE) ×2 IMPLANT
TOWEL OR NON WOVEN STRL DISP B (DISPOSABLE) ×2 IMPLANT
YANKAUER SUCT BULB TIP NO VENT (SUCTIONS) ×1 IMPLANT

## 2013-10-22 NOTE — Brief Op Note (Signed)
10/22/2013  2:21 PM  PATIENT:  [name] [name]  47 y.o. male  PRE-OPERATIVE DIAGNOSIS:  Endstage osteoarthritis left hip  POST-OPERATIVE DIAGNOSIS:  Endstage osteoarthritis left hip  PROCEDURE:  Procedure(s): LEFT TOTAL HIP ARTHROPLASTY ANTERIOR APPROACH (Left)  SURGEON:  Surgeon(s) and Role:    * Kathryne Hitch, MD - Primary  PHYSICIAN ASSISTANT: Rexene Edison, PA-C  ANESTHESIA:   general  EBL:  Total I/O In: 1000 [I.V.:1000] Out: 400 [Urine:200; Blood:200]  BLOOD ADMINISTERED:none  DRAINS: none   LOCAL MEDICATIONS USED:  NONE  SPECIMEN:  No Specimen  DISPOSITION OF SPECIMEN:  N/A  COUNTS:  YES  TOURNIQUET:  * No tourniquets in log *  DICTATION: .Other Dictation: Dictation Number [id number]  PLAN OF CARE: Admit to inpatient   PATIENT DISPOSITION:  PACU - hemodynamically stable.   Delay start of Pharmacological VTE agent (>24hrs) due to surgical blood loss or risk of bleeding: no

## 2013-10-22 NOTE — Anesthesia Preprocedure Evaluation (Addendum)
Anesthesia Evaluation  Patient identified by MRN, date of birth, ID band Patient awake    Reviewed: Allergy & Precautions, H&P , NPO status , Patient's Chart, lab work & pertinent test results  Airway Mallampati: II TM Distance: >3 FB Neck ROM: full    Dental  (+) Loose and Dental Advisory Given Very loose left upper front tooth.  Patient told high risk to lose it and possibility of aspiration of tooth:   Pulmonary neg pulmonary ROS, former smoker,  breath sounds clear to auscultation  Pulmonary exam normal       Cardiovascular hypertension, Pt. on medications Rhythm:regular Rate:Normal     Neuro/Psych Right leg weakness negative neurological ROS  negative psych ROS   GI/Hepatic negative GI ROS, Neg liver ROS,   Endo/Other  negative endocrine ROSMorbid obesity  Renal/GU negative Renal ROS  negative genitourinary   Musculoskeletal   Abdominal (+) + obese,   Peds  Hematology negative hematology ROS (+)   Anesthesia Other Findings   Reproductive/Obstetrics negative OB ROS                         Anesthesia Physical Anesthesia Plan  ASA: III  Anesthesia Plan: General   Post-op Pain Management:    Induction: Intravenous  Airway Management Planned: Oral ETT  Additional Equipment:   Intra-op Plan:   Post-operative Plan: Extubation in OR  Informed Consent: I have reviewed the patients History and Physical, chart, labs and discussed the procedure including the risks, benefits and alternatives for the proposed anesthesia with the patient or authorized representative who has indicated his/her understanding and acceptance.   Dental Advisory Given  Plan Discussed with: CRNA and Surgeon  Anesthesia Plan Comments:        Anesthesia Quick Evaluation

## 2013-10-22 NOTE — Anesthesia Postprocedure Evaluation (Signed)
  Anesthesia Post-op Note  Patient: [name] [name]  Procedure(s) Performed: Procedure(s) (LRB): LEFT TOTAL HIP ARTHROPLASTY ANTERIOR APPROACH (Left)  Patient Location: PACU  Anesthesia Type: General  Level of Consciousness: awake and alert   Airway and Oxygen Therapy: Patient Spontanous Breathing  Post-op Pain: mild  Post-op Assessment: Post-op Vital signs reviewed, Patient's Cardiovascular Status Stable, Respiratory Function Stable, Patent Airway and No signs of Nausea or vomiting  Last Vitals:  Filed Vitals:   10/22/13 1515  BP: 141/85  Pulse:   Temp:   Resp:     Post-op Vital Signs: stable   Complications: No apparent anesthesia complications

## 2013-10-22 NOTE — Preoperative (Signed)
Beta Blockers   Reason not to administer Beta Blockers:Not Applicable 

## 2013-10-22 NOTE — Anesthesia Procedure Notes (Signed)
Procedure Name: Intubation Date/Time: 10/22/2013 1:21 PM Performed by: Orest Dikes Pre-anesthesia Checklist: Patient identified, Emergency Drugs available, Suction available, Patient being monitored and Timeout performed Patient Re-evaluated:Patient Re-evaluated prior to inductionOxygen Delivery Method: Circle system utilized Preoxygenation: Pre-oxygenation with 100% oxygen Intubation Type: IV induction Ventilation: Two handed mask ventilation required and Oral airway inserted - appropriate to patient size Laryngoscope size: glide scope 4 blade used. Grade View: Grade I Tube type: Oral Tube size: 7.5 mm Number of attempts: 2 (DL X 1 WITH Miller 3, grade 3 view. Second DL with glidescope 4 blade. AOI with 7.5 cuffed ETT) Airway Equipment and Method: Video-laryngoscopy,  Oral airway and Bite block Placement Confirmation: ETT inserted through vocal cords under direct vision,  positive ETCO2 and breath sounds checked- equal and bilateral Secured at: 24 cm Tube secured with: Tape Dental Injury: Teeth and Oropharynx as per pre-operative assessment  Difficulty Due To: Difficult Airway- due to dentition, Difficult Airway- due to reduced neck mobility and Difficult Airway- due to limited oral opening Future Recommendations: Recommend- induction with short-acting agent, and alternative techniques readily available

## 2013-10-22 NOTE — H&P (Signed)
TOTAL HIP ADMISSION H&P  Patient is admitted for left total hip arthroplasty.  Subjective:  Chief Complaint: left hip pain  HPI: [name] [name], 47 y.o. male, has a history of pain and functional disability in the left hip(s) due to arthritis and patient has failed non-surgical conservative treatments for greater than 12 weeks to include NSAID's and/or analgesics, corticosteriod injections, supervised PT with diminished ADL's post treatment, use of assistive devices, weight reduction as appropriate and activity modification.  Onset of symptoms was gradual starting 3 years ago with gradually worsening course since that time.The patient noted no past surgery on the left hip(s).  Patient currently rates pain in the left hip at 10 out of 10 with activity. Patient has night pain, worsening of pain with activity and weight bearing, trendelenberg gait, pain that interfers with activities of daily living and pain with passive range of motion. Patient has evidence of subchondral cysts, subchondral sclerosis, periarticular osteophytes and joint space narrowing by imaging studies. This condition presents safety issues increasing the risk of falls.  There is no current active infection.  Patient Active Problem List   Diagnosis Date Noted  . Arthritis pain of left hip 10/22/2013  . Right leg weakness 06/28/2013  . Difficulty in walking(719.7) 06/28/2013  . Gait disturbance 06/28/2013  . Pain in joint, other specified sites 06/28/2013  . Degenerative arthritis of hip, right 06/11/2013   Past Medical History  Diagnosis Date  . Hypertension   . Arthritis     SEVERE OA AND PAIN BOTH HIPS   . Low testosterone   . Hypercholesteremia     Past Surgical History  Procedure Laterality Date  . Total hip arthroplasty Right 06/11/2013    Procedure: RIGHT TOTAL HIP ARTHROPLASTY ANTERIOR APPROACH;  Surgeon: Kathryne Hitch, MD;  Location: [facility];  Service: Orthopedics;  Laterality: Right;    No  prescriptions prior to admission   No Known Allergies  History  Substance Use Topics  . Smoking status: Former Smoker -- 19 years    Types: Cigarettes    Quit date: 11/18/1997  . Smokeless tobacco: Never Used  . Alcohol Use: No    No family history on file.   Review of Systems  Musculoskeletal: Positive for joint pain.  All other systems reviewed and are negative.    Objective:  Physical Exam  Constitutional: He is oriented to person, place, and time. He appears well-developed and well-nourished.  HENT:  Head: Normocephalic and atraumatic.  Eyes: EOM are normal. Pupils are equal, round, and reactive to light.  Neck: Normal range of motion. Neck supple.  Cardiovascular: Normal rate and regular rhythm.   Respiratory: Effort normal and breath sounds normal.  GI: Soft. Bowel sounds are normal.  Musculoskeletal:       Left hip: He exhibits decreased range of motion, decreased strength, tenderness and bony tenderness.  Neurological: He is alert and oriented to person, place, and time.  Skin: Skin is warm and dry.  Psychiatric: He has a normal mood and affect.    Vital signs in last 24 hours:    Labs:   Estimated body mass index is 41.58 kg/(m^2) as calculated from the following:   Height as of 06/11/13: 5\' 11"  (1.803 m).   Weight as of 06/09/13: 135.172 kg (298 lb).   Imaging Review Plain radiographs demonstrate severe degenerative joint disease of the left hip(s). The bone quality appears to be excellent for age and reported activity level.  Assessment/Plan:  End stage arthritis, left hip(s)  The patient history, physical examination, clinical judgement of the provider and imaging studies are consistent with end stage degenerative joint disease of the left hip(s) and total hip arthroplasty is deemed medically necessary. The treatment options including medical management, injection therapy, arthroscopy and arthroplasty were discussed at length. The risks and benefits of  total hip arthroplasty were presented and reviewed. The risks due to aseptic loosening, infection, stiffness, dislocation/subluxation,  thromboembolic complications and other imponderables were discussed.  The patient acknowledged the explanation, agreed to proceed with the plan and consent was signed. Patient is being admitted for inpatient treatment for surgery, pain control, PT, OT, prophylactic antibiotics, VTE prophylaxis, progressive ambulation and ADL's and discharge planning.The patient is planning to be discharged home with home health services

## 2013-10-22 NOTE — Transfer of Care (Signed)
Immediate Anesthesia Transfer of Care Note  Patient: [name] [name]  Procedure(s) Performed: Procedure(s) (LRB): LEFT TOTAL HIP ARTHROPLASTY ANTERIOR APPROACH (Left)  Patient Location: PACU  Anesthesia Type: General  Level of Consciousness: sedated, patient cooperative and responds to stimulation  Airway & Oxygen Therapy: Patient Spontanous Breathing and Patient connected to face mask oxgen  Post-op Assessment: Report given to PACU RN and Post -op Vital signs reviewed and stable  Post vital signs: Reviewed and stable  Complications: No apparent anesthesia complications

## 2013-10-22 NOTE — Plan of Care (Signed)
Problem: Consults Goal: Diagnosis- Total Joint Replacement Left anterior hip     

## 2013-10-23 LAB — BASIC METABOLIC PANEL
BUN: 14 mg/dL (ref 6–23)
CO2: 26 mEq/L (ref 19–32)
Calcium: 9 mg/dL (ref 8.4–10.5)
GFR calc non Af Amer: 81 mL/min — ABNORMAL LOW (ref >90)
Glucose, Bld: 121 mg/dL — ABNORMAL HIGH (ref 70–99)
Potassium: 5.1 mEq/L (ref 3.5–5.1)
Sodium: 133 mEq/L — ABNORMAL LOW (ref 135–145)

## 2013-10-23 LAB — CBC
HCT: 37.7 % — ABNORMAL LOW (ref 39.0–52.0)
Hemoglobin: 13.2 g/dL (ref 13.0–17.0)
MCH: 30.6 pg (ref 26.0–34.0)
MCHC: 35 g/dL (ref 30.0–36.0)
MCV: 87.3 fL (ref 78.0–100.0)
RBC: 4.32 MIL/uL (ref 4.22–5.81)

## 2013-10-23 MED ORDER — METHOCARBAMOL 500 MG PO TABS: 500.0000 mg | ORAL_TABLET | Freq: Four times a day (QID) | ORAL | Status: DC | PRN

## 2013-10-23 MED ORDER — OXYCODONE-ACETAMINOPHEN 5-325 MG PO TABS: 1.0000 | ORAL_TABLET | ORAL | Status: DC | PRN

## 2013-10-23 MED ORDER — ASPIRIN 325 MG PO TBEC: 325.0000 mg | DELAYED_RELEASE_TABLET | Freq: Two times a day (BID) | ORAL | Status: AC

## 2013-10-23 NOTE — Evaluation (Signed)
Occupational Therapy Evaluation Patient Details Name: [name] [name] MRN: [id number] DOB: October 15, 1966 Today's Date: 10/23/2013 Time: 4098-1191 OT Time Calculation (min): 22 min  OT Assessment / Plan / Recommendation History of present illness Pt is s/p L anterior direct THA   Clinical Impression   All education complete regarding ADL activity s/p THA  .     OT Assessment  Patient does not need any further OT services    Follow Up Recommendations  No OT follow up       Equipment Recommendations  None recommended by OT                 ADL  Grooming: Wash/dry hands;Supervision/safety Where Assessed - Grooming: Unsupported standing Upper Body Dressing: Set up Where Assessed - Upper Body Dressing: Unsupported sitting Lower Body Dressing: Minimal assistance Where Assessed - Lower Body Dressing: Unsupported sit to stand Toilet Transfer: Supervision/safety Toilet Transfer Method: Sit to Barista: Regular height toilet Toileting - Clothing Manipulation and Hygiene: Supervision/safety Where Assessed - Toileting Clothing Manipulation and Hygiene: Standing Transfers/Ambulation Related to ADLs: Educated pt in strategies for bathing and dressing. Pt has a reach in which he can borrow.  Wife will A as needed. Educated on bathroom safety and fall prevention      OT Goals(Current goals can be found in the care plan section) Acute Rehab OT Goals 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)  Visit Information  Assistance Needed: +1 History of Present Illness: Pt is s/p L anterior direct THA       Prior Functioning     Home Living Family/patient expects to be discharged to:: Private residence Living Arrangements: Spouse/significant other Available Help at Discharge: Family;Available 24 hours/day Type of Home: House Home Access: Stairs to enter [employer] of Steps: 2-3 Entrance Stairs-Rails: None Home Layout: One level Home Equipment:  Cane - single point;Walker - 2 wheels Additional Comments: sink next to toilet Prior Function Level of Independence: Independent Comments: working out; walking Communication Communication: No difficulties         Vision/Perception Vision - History Patient Visual Report: No change from baseline   Cognition  Cognition Arousal/Alertness: Awake/alert Behavior During Therapy: WFL for tasks assessed/performed Overall Cognitive Status: Within Functional Limits for tasks assessed    Extremity/Trunk Assessment Upper Extremity Assessment Upper Extremity Assessment: Overall WFL for tasks assessed Lower Extremity Assessment Lower Extremity Assessment: LLE deficits/detail LLE: Unable to fully assess due to pain     Mobility Bed Mobility Bed Mobility: Supine to Sit Supine to Sit: 4: Min assist Details for Bed Mobility Assistance: min with LLE Transfers Sit to Stand: 5: Supervision;From toilet;From chair/3-in-1;With upper extremity assist Stand to Sit: To chair/3-in-1;With upper extremity assist Details for Transfer Assistance: cues for hand placement and safety           End of Session OT - End of Session Equipment Utilized During Treatment: Rolling walker Activity Tolerance: Patient tolerated treatment well Patient left: in chair;with call bell/phone within reach  GO     Anshul Meddings, [place] Kung 10/23/2013, 3:00 PM

## 2013-10-23 NOTE — Op Note (Signed)
[name], [name]              ACCOUNT NO.:  [id number]  MEDICAL RECORD NO.:  [id number]  LOCATION:  1608                         FACILITY:  [facility]  PHYSICIAN:  Vanita Panda. Magnus Ivan, M.D.DATE OF BIRTH:  [birth year]  DATE OF PROCEDURE:  10/22/2013 DATE OF DISCHARGE:                              OPERATIVE REPORT   PREOPERATIVE DIAGNOSIS:  Severe end-stage arthritis and degenerative joint disease, left hip.  POSTOPERATIVE DIAGNOSIS:  Severe end-stage arthritis and degenerative joint disease, left hip.  PROCEDURE:  Left total hip arthroplasty through direct anterior approach.  IMPLANTS:  DePuy Sector Gription acetabular component size 54, size 36+ 4 neutral polyethylene liner, size 12 Corail femoral component with varus offset (KLA), size 36+ 1.5 ceramic hip ball.  SURGEON:  Vanita Panda. Magnus Ivan, M.D.  ASSISTANT:  Richardean Canal, P.A-C.  ANESTHESIA:  General.  ANTIBIOTICS:  2 g IV Ancef.  BLOOD LOSS:  200-300 mL.  COMPLICATIONS:  None.  INDICATIONS:  Mr. [name] [name] is a 47 year old gentleman well-known to me. He had severe bilateral hip osteoarthritis and underwent successful right total hip arthroplasty in July of this year.  He now presents to have his left hip replaced.  His x-ray showed complete loss of the joint space in the left hip.  There are subchondral sclerosis and subchondral cystic changes.  There are periarticular osteophytes as well.  He has severe daily pain with decrease in mobility and decrease in activities of daily living.  At this point, he wished to proceed with a left total hip arthroplasty.  He understands the risk of acute blood loss anemia, nerve and vessel injury, fracture, infection, and DVT.  He understands the goals were decreased pain, improved mobility, and overall improved quality of life.  PROCEDURE DESCRIPTION:  After informed consent was obtained, appropriate left hip was marked.  He was brought to the operating room.   General anesthesia was obtained while he was supine on his stretcher.  Traction boots were placed on both his feet.  Next, he was placed supine on the Hana fracture table with perineal post in place and both legs in inline skeletal traction, but no traction applied.  His left operative hip was then prepped and draped with DuraPrep and sterile drapes.  A time-out was called to identify correct patient, correct left hip.  I then made incision inferior and posterior to the anterior-superior iliac spine and carried this obliquely down the leg.  I dissected down to the tensor fascia lata muscle and the tensor fascia was divided longitudinally.  I then cauterized the lateral femoral circumflex vessels and proceeded with a direct anterior approach to the hip.  A Cobra retractor was placed around the lateral neck and up underneath the rectus femoris.  A Cobra retractor was placed medially.  I then opened up the joint capsule in a L-type format finding a large joint effusion and placing the Cobra retractors within the joint capsule.  I then made my femoral neck cut with an oscillating saw just proximal to the lesser trochanter and completed this with an osteotome.  I placed a corkscrew guide in the femoral head and removed the femoral head in its entirety and found to be completely devoid of cartilage.  I then placed a Bent Hohmann around the medial acetabular rim and a Cobra retractor laterally.  I used a knife to debride remnants of acetabular labrum and tissue deep in the socket.  I then began reaming in 2 mm increments from size 42 up to a size 54.  I reamed up to a size 54 with all reamer straight placed under direct visualization and last reamer under direct fluoroscopy, so we could obtain my depth of reaming, my inclination and anteversion.  Once I was pleased with this, I placed a real DePuy sector Gription acetabular component size 54, the apex hole eliminator guide, and a 36+ 4 neutral  polyethylene liner.  Attention was then turned to the femur. I externally rotated the leg to 100 degrees, extended and adducted leg. I placed the Mueller retractor medially and Hohmann retractor behind the greater trochanter.  I then released the lateral joint capsule and used a box cutting osteotome to open up the femoral canal and a rongeur to lateralize.  Then, began broaching from a size 8 broach using the Corail broaching system up to a size 12 just like the other side.  I then trialed a varus offset femoral neck and a 36+ 1.5 hip ball.  We brought the leg back over and up with traction and internal rotation reducing the pelvis and it was stable with internal and external rotation with minimal shuck and leg lengths were measured near equal under direct fluoroscopy.  The offsets were near equal as well.  We then dislocated the hip and removed the trial components.  We then placed a real Corail femoral component size 12 with varus offset and the real 36+ 1.5 ceramic hip ball and reduced this back in the acetabulum, again it was stable. We then copiously irrigated the soft tissue with normal saline solution using pulsatile lavage.  We closed the joint capsule with interrupted #1 Ethibond suture followed by running #1 Vicryl in the tensor fascia, 0 Vicryl in the deep tissue, 2-0 Vicryl in the subcutaneous tissues, 4-0 Monocryl subcuticular stitch, and Steri-Strips on the skin.  An Aquacel dressing was applied.  He was then taken off the Hana table, awakened, extubated, and taken to recovery room in stable condition.  All final counts were correct.  There were no complications noted.     Vanita Panda. Magnus Ivan, M.D.     CYB/MEDQ  D:  10/22/2013  T:  10/23/2013  Job:  [id number]

## 2013-10-23 NOTE — Evaluation (Signed)
Physical Therapy Evaluation Patient Details Name: [name] [name] MRN: [id number] DOB: 1966-06-15 Today's Date: 10/23/2013 Time: 0945-1000 PT Time Calculation (min): 15 min  PT Assessment / Plan / Recommendation History of Present Illness  Pt is s/p L anterior direct THA  Clinical Impression  Pt will benefit form PT to address deficits below    PT Assessment  Patient needs continued PT services    Follow Up Recommendations  No PT follow up    Does the patient have the potential to tolerate intense rehabilitation      Barriers to Discharge        Equipment Recommendations  None recommended by PT    Recommendations for Other Services     Frequency 7X/week    Precautions / Restrictions Precautions Precautions: None Restrictions Other Position/Activity Restrictions: WBAT   Pertinent Vitals/Pain 5/10 pain; RN notified and gave meds prior to amb      Mobility  Bed Mobility Bed Mobility: Supine to Sit Supine to Sit: 4: Min assist Details for Bed Mobility Assistance: min with LLE Transfers Transfers: Sit to Stand;Stand to Sit Sit to Stand: 4: Min guard Stand to Sit: 4: Min guard Details for Transfer Assistance: cues for hand placement and safety Ambulation/Gait Ambulation/Gait Assistance: 4: Min guard Ambulation Distance (Feet): 120 Feet (15 more) Assistive device: Rolling walker Ambulation/Gait Assistance Details: cues for RW safety Gait Pattern: Step-through pattern;Step-to pattern;Antalgic    Exercises     PT Diagnosis: Difficulty walking  PT Problem List: Decreased activity tolerance;Decreased mobility;Decreased knowledge of use of DME PT Treatment Interventions: DME instruction;Gait training;Stair training;Functional mobility training;Therapeutic activities;Therapeutic exercise;Patient/family education     PT Goals(Current goals can be found in the care plan section) Acute Rehab PT Goals 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) PT Goal  Formulation: With patient Time For Goal Achievement: 10/26/13 Potential to Achieve Goals: Good  Visit Information  Last PT Received On: 10/23/13 Assistance Needed: +1 History of Present Illness: Pt is s/p L anterior direct THA       Prior Functioning  Home Living Family/patient expects to be discharged to:: Private residence Living Arrangements: Spouse/significant other Available Help at Discharge: Family;Available 24 hours/day Type of Home: House Home Access: Stairs to enter [employer] of Steps: 2-3 Entrance Stairs-Rails: None Home Layout: One level Home Equipment: Cane - single point;Walker - 2 wheels Additional Comments: sink next to toilet Prior Function Level of Independence: Independent Comments: working out; walking Communication Communication: No difficulties    Cognition  Cognition Arousal/Alertness: Awake/alert Behavior During Therapy: WFL for tasks assessed/performed Overall Cognitive Status: Within Functional Limits for tasks assessed    Extremity/Trunk Assessment Upper Extremity Assessment Upper Extremity Assessment: Overall WFL for tasks assessed Lower Extremity Assessment Lower Extremity Assessment: LLE deficits/detail LLE: Unable to fully assess due to pain   Balance    End of Session PT - End of Session Activity Tolerance: Patient tolerated treatment well Patient left: in chair;with call bell/phone within reach Nurse Communication: Mobility status  GP     [facility] 10/23/2013, 11:08 AM

## 2013-10-23 NOTE — Progress Notes (Signed)
Subjective: 1 Day Post-Op Procedure(s) (LRB): LEFT TOTAL HIP ARTHROPLASTY ANTERIOR APPROACH (Left) Patient reports pain as moderate.    Objective: Vital signs in last 24 hours: Temp:  [97.9 F (36.6 C)-98.6 F (37 C)] 98.2 F (36.8 C) (12/06 0541) Pulse Rate:  [69-101] 81 (12/06 0541) Resp:  [12-24] 18 (12/06 0800) BP: (121-187)/(73-92) 127/76 mmHg (12/06 0541) SpO2:  [8 %-99 %] 98 % (12/06 0800) Weight:  [141.069 kg (311 lb)] 141.069 kg (311 lb) (12/05 1615)  Intake/Output from previous day: 12/05 0701 - 12/06 0700 In: 3246.3 [P.O.:720; I.V.:2416.3; IV Piggyback:110] Out: 1500 [Urine:1300; Blood:200] Intake/Output this shift: Total I/O In: -  Out: 300 [Urine:300]   Recent Labs  10/23/13 0511  HGB 13.2    Recent Labs  10/23/13 0511  WBC 12.9*  RBC 4.32  HCT 37.7*  PLT 199    Recent Labs  10/23/13 0511  NA 133*  K 5.1  CL 99  CO2 26  BUN 14  CREATININE 1.06  GLUCOSE 121*  CALCIUM 9.0   No results found for this basename: LABPT, INR,  in the last 72 hours  Sensation intact distally Intact pulses distally Dorsiflexion/Plantar flexion intact Incision: scant drainage  Assessment/Plan: 1 Day Post-Op Procedure(s) (LRB): LEFT TOTAL HIP ARTHROPLASTY ANTERIOR APPROACH (Left) Up with therapy Plan for discharge tomorrow  Kathryne Hitch 10/23/2013, 10:00 AM

## 2013-10-23 NOTE — Progress Notes (Signed)
10/23/13 1500  PT Visit Information  Last PT Received On 10/23/13  Assistance Needed +1  History of Present Illness Pt is s/p L anterior direct THA  PT Time Calculation  PT Start Time 1456  PT Stop Time 1518  PT Time Calculation (min) 22 min  Subjective Data  Patient Stated Goal back to work ([employer])  Precautions  Precautions None  Cognition  Arousal/Alertness Awake/alert  Behavior During Therapy WFL for tasks assessed/performed  Overall Cognitive Status Within Functional Limits for tasks assessed  Bed Mobility  Bed Mobility Sit to Supine  Sit to Supine 4: Min assist  Details for Bed Mobility Assistance min with LLE  Transfers  Transfers Sit to Stand;Stand to Sit  Sit to Stand 5: Supervision  Stand to Sit 5: Supervision;To bed  Details for Transfer Assistance cues for hand placement and safety  Ambulation/Gait  Ambulation/Gait Assistance 5: Supervision;4: Min guard  Ambulation Distance (Feet) 200 Feet  Assistive device Rolling walker  Ambulation/Gait Assistance Details cues for RW safety   Gait Pattern Step-through pattern;Step-to pattern;Antalgic  Static Standing Balance  Static Standing - Balance Support No upper extremity supported  Static Standing - Level of Assistance 5: Stand by assistance  Total Joint Exercises  Ankle Circles/Pumps AROM;Both;10 reps  Quad Sets AROM;Both;15 reps  Heel Slides AAROM;Left;15 reps  Hip ABduction/ADduction AAROM;Left;15 reps  Long [address] [place];Left;15 reps;Seated  PT - End of Session  Activity Tolerance Patient tolerated treatment well  Patient left in bed;with call bell/phone within reach  Nurse Communication Mobility status  PT - Assessment/Plan  PT Plan Current plan remains appropriate  PT Frequency 7X/week  Follow Up Recommendations No PT follow up  PT equipment None recommended by PT  PT Goal Progression  Progress towards PT goals Progressing toward goals  Acute Rehab PT Goals  Time For Goal Achievement 10/26/13   Potential to Achieve Goals Good  PT General Charges  $$ ACUTE PT VISIT 1 Procedure  PT Treatments  $Gait Training 8-22 mins

## 2013-10-23 NOTE — Progress Notes (Signed)
   CARE MANAGEMENT NOTE 10/23/2013  Patient:  [facility]   Account Number:  [id number]  Date Initiated:  10/23/2013  Documentation initiated by:  [facility]  Subjective/Objective Assessment:   LEFT TOTAL HIP ARTHROPLASTY ANTERIOR APPROACH     Action/Plan:   HH   Anticipated DC Date:  10/24/2013   Anticipated DC Plan:  HOME W HOME HEALTH SERVICES      DC Planning Services  CM consult      Choice offered to / List presented to:          [facility] arranged  HH - 11 Patient Refused      Status of service:  Completed, signed off Medicare Important Message given?   (If response is "NO", the following Medicare IM given date fields will be blank) Date Medicare IM given:   Date Additional Medicare IM given:    Discharge Disposition:  HOME/SELF CARE  Per UR Regulation:    If discussed at Long Length of Stay Meetings, dates discussed:    Comments:  10/23/2013 1715 NCM spoke to pt and states he has RW and bedside commode at home. He mainly uses his cane. States he does not want HH at this time. He is able to do the exercises at home. NCM explained if decides he can discuss with his physician at his follow up appt. Isidoro Donning RN CCM Case Mgmt phone [phone]

## 2013-10-24 LAB — CBC
MCHC: 33.5 g/dL (ref 30.0–36.0)
Platelets: 189 10*3/uL (ref 150–400)
RDW: 13.1 % (ref 11.5–15.5)
WBC: 13.2 10*3/uL — ABNORMAL HIGH (ref 4.0–10.5)

## 2013-10-24 NOTE — Progress Notes (Signed)
Pt stable, scripts, d/c instructions given with no questions/concerns voiced by pt or wife.  Pt transported via wheelchair to private vehicle by NT and wife. 

## 2013-10-24 NOTE — Progress Notes (Signed)
Physical Therapy Treatment Patient Details Name: [name] [name] MRN: [id number] DOB: 01/19/1966 Today's Date: 10/24/2013 Time: 4098-1191 PT Time Calculation (min): 35 min  PT Assessment / Plan / Recommendation  History of Present Illness Pt is s/p L anterior direct THA   PT Comments   Reviewed car transfers, stairs and home therex program with pt.  Follow Up Recommendations  No PT follow up     Does the patient have the potential to tolerate intense rehabilitation     Barriers to Discharge        Equipment Recommendations  None recommended by PT    Recommendations for Other Services    Frequency 7X/week   Progress towards PT Goals Progress towards PT goals: Progressing toward goals  Plan Current plan remains appropriate    Precautions / Restrictions Precautions Precautions: None Restrictions Weight Bearing Restrictions: No Other Position/Activity Restrictions: WBAT   Pertinent Vitals/Pain 5/10; premed, ice pack provided    Mobility  Bed Mobility Bed Mobility: Supine to Sit Sit to Supine: 5: Supervision Transfers Transfers: Sit to Stand;Stand to Sit Sit to Stand: 5: Supervision Stand to Sit: 5: Supervision Details for Transfer Assistance: cues for hand placement and safety Ambulation/Gait Ambulation/Gait Assistance: 4: Min guard;5: Supervision Ambulation Distance (Feet): 240 Feet Assistive device: Rolling walker Ambulation/Gait Assistance Details: cues for posture and position from RW Gait Pattern: Step-through pattern;Step-to pattern;Antalgic Stairs: Yes Stairs Assistance: 4: Min guard Stair Management Technique: No rails;Step to pattern;Forwards;With crutches Number of Stairs: 4    Exercises Total Joint Exercises Ankle Circles/Pumps: AROM;Both;15 reps Quad Sets: AROM;Both;15 reps Gluteal Sets: AROM;Both;10 reps;Supine Heel Slides: AAROM;Left;20 reps;Supine Hip ABduction/ADduction: AAROM;Left;20 reps;Supine Long Arc Quad: AAROM;Left;15 reps;Seated   PT  Diagnosis:    PT Problem List:   PT Treatment Interventions:     PT Goals (current goals can now be found in the care plan section) Acute Rehab PT Goals 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) PT Goal Formulation: With patient Time For Goal Achievement: 10/26/13 Potential to Achieve Goals: Good  Visit Information  Last PT Received On: 10/24/13 Assistance Needed: +1 History of Present Illness: Pt is s/p L anterior direct THA    Subjective Data  Subjective: I am leaving today 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

## 2013-10-24 NOTE — Discharge Summary (Signed)
  Final diagnosis osteoarthritis left hip. Surgical procedure right total hip arthroplasty anterior approach. Discharge medications Percocet for pain ask them for DVT prophylaxis Robaxin for muscle absent. Followup in the office in 2 weeks.

## 2013-10-25 NOTE — Progress Notes (Signed)
Utilization review completed.  

## 2013-10-29 ENCOUNTER — Telehealth: Payer: Self-pay

## 2013-10-29 NOTE — Telephone Encounter (Signed)
Pt was referred by Dr. Regino Schultze for screening colonoscopy. He just had hip replacement and would like a call sometime in Jan. 2015. I have him on my call list for then.

## 2013-11-22 NOTE — Telephone Encounter (Signed)
Letter reminder mailed to pt to call and schedule colonoscopy when he has been discharged from the surgeon.

## 2014-05-12 ENCOUNTER — Encounter: Payer: Self-pay | Admitting: *Deleted

## 2014-06-15 ENCOUNTER — Ambulatory Visit: Payer: BC Managed Care – PPO | Admitting: Gastroenterology

## 2014-06-15 ENCOUNTER — Telehealth: Payer: Self-pay

## 2014-06-15 ENCOUNTER — Other Ambulatory Visit: Payer: Self-pay

## 2014-06-15 ENCOUNTER — Encounter (INDEPENDENT_AMBULATORY_CARE_PROVIDER_SITE_OTHER): Payer: Self-pay

## 2014-06-15 DIAGNOSIS — Z1211 Encounter for screening for malignant neoplasm of colon: Secondary | ICD-10-CM

## 2014-06-15 NOTE — Telephone Encounter (Signed)
For GI procedures antibiotics no longer recommend for prophylaxis for patient with joint replacements. If patient has concerns, he can request his surgeon to provide prophylactic antibiotics.   OK to schedule.

## 2014-06-15 NOTE — Telephone Encounter (Signed)
Routing to [place] for further instructions.

## 2014-06-15 NOTE — Telephone Encounter (Signed)
Gastroenterology Pre-Procedure Review  Request Date: 06/15/2014 Requesting Physician: Dr. Phillips OdorGolding  PATIENT REVIEW QUESTIONS: The patient responded to the following health history questions as indicated:    1. Diabetes Melitis: no 2. Joint replacements in the past 12 months: YES  Left hip replacement in 10/2013 3. Major health problems in the past 3 months: no 4. Has an artificial valve or MVP: no 5. Has a defibrillator: no 6. Has been advised in past to take antibiotics in advance of a procedure like teeth cleaning: YES   ( after hip replacement)    MEDICATIONS & ALLERGIES:    Patient reports the following regarding taking any blood thinners:   Plavix? no Aspirin? YES Coumadin? no  Patient confirms/reports the following medications:  Current Outpatient Prescriptions  Medication Sig Dispense Refill  . aspirin EC 325 MG EC tablet Take 1 tablet (325 mg total) by mouth 2 (two) times daily after a meal.  30 tablet  0  . fish oil-omega-3 fatty acids 1000 MG capsule Take 1 g by mouth daily.      Marland Kitchen. losartan-hydrochlorothiazide (HYZAAR) 100-25 MG per tablet Take 1 tablet by mouth daily.      . pravastatin (PRAVACHOL) 10 MG tablet Take 20 mg by mouth at bedtime.       Marland Kitchen. amLODipine (NORVASC) 5 MG tablet Take 5 mg by mouth every morning.       . methocarbamol (ROBAXIN) 500 MG tablet Take 1 tablet (500 mg total) by mouth every 6 (six) hours as needed for muscle spasms.  60 tablet  0  . oxyCODONE-acetaminophen (ROXICET) 5-325 MG per tablet Take 1-2 tablets by mouth every 4 (four) hours as needed for severe pain.  60 tablet  0  . testosterone cypionate (DEPOTESTOTERONE CYPIONATE) 100 MG/ML injection Inject 100 mg into the muscle every 30 (thirty) days. For IM use only       No current facility-administered medications for this visit.    Patient confirms/reports the following allergies:  No Known Allergies  No orders of the defined types were placed in this encounter.    AUTHORIZATION  INFORMATION Primary Insurance:  ID #:  Group #:  Pre-Cert / Auth required:  Pre-Cert / Auth #:   Secondary Insurance:   ID #:   Group #:  Pre-Cert / Auth required: Pre-Cert / Auth #:   SCHEDULE INFORMATION: Procedure has been scheduled as follows:  Date: 07/04/2014              Time: 9:45 AM Location: [facility] Short Stay  This Gastroenterology Pre-Precedure Review Form is being routed to the following provider(s): R. Roetta SessionsMichael Rourk, MD

## 2014-06-15 NOTE — Telephone Encounter (Signed)
As discussed. Let's give him trilyte prep. Also take additional dulcolax 10mg  orally daily for 3 days before prep.

## 2014-06-17 ENCOUNTER — Encounter (HOSPITAL_COMMUNITY): Payer: Self-pay | Admitting: Pharmacy Technician

## 2014-06-20 MED ORDER — PEG 3350-KCL-NA BICARB-NACL 420 G PO SOLR: 4000.0000 mL | ORAL | Status: DC

## 2014-06-20 NOTE — Telephone Encounter (Signed)
Rx sent to the pharmacy and instructions mailed to pt.  

## 2014-07-04 ENCOUNTER — Ambulatory Visit (HOSPITAL_COMMUNITY)
Admission: RE | Admit: 2014-07-04 | Discharge: 2014-07-04 | Disposition: A | Discharge status: Home / Self Care | Payer: BC Managed Care – PPO | Source: Ambulatory Visit | Attending: Internal Medicine | Admitting: Internal Medicine

## 2014-07-04 ENCOUNTER — Encounter (HOSPITAL_COMMUNITY): Payer: Self-pay | Admitting: *Deleted

## 2014-07-04 ENCOUNTER — Encounter (HOSPITAL_COMMUNITY)
Admission: RE | Disposition: A | Discharge status: Home / Self Care | Payer: Self-pay | Source: Ambulatory Visit | Attending: Internal Medicine

## 2014-07-04 DIAGNOSIS — Z7982 Long term (current) use of aspirin: Secondary | ICD-10-CM | POA: Insufficient documentation

## 2014-07-04 DIAGNOSIS — E7800 Pure hypercholesterolemia, unspecified: Secondary | ICD-10-CM | POA: Insufficient documentation

## 2014-07-04 DIAGNOSIS — I10 Essential (primary) hypertension: Secondary | ICD-10-CM | POA: Insufficient documentation

## 2014-07-04 DIAGNOSIS — Q438 Other specified congenital malformations of intestine: Secondary | ICD-10-CM | POA: Insufficient documentation

## 2014-07-04 DIAGNOSIS — Z1211 Encounter for screening for malignant neoplasm of colon: Secondary | ICD-10-CM | POA: Insufficient documentation

## 2014-07-04 DIAGNOSIS — Z79899 Other long term (current) drug therapy: Secondary | ICD-10-CM | POA: Diagnosis not present

## 2014-07-04 HISTORY — PX: COLONOSCOPY: SHX5424

## 2014-07-04 SURGERY — COLONOSCOPY
Anesthesia: Moderate Sedation

## 2014-07-04 MED ORDER — MIDAZOLAM HCL 5 MG/5ML IJ SOLN
INTRAMUSCULAR | Status: AC
  Filled 2014-07-04: qty 10

## 2014-07-04 MED ORDER — ONDANSETRON HCL 4 MG/2ML IJ SOLN
INTRAMUSCULAR | Status: DC | PRN
  Administered 2014-07-04: 4 mg via INTRAVENOUS

## 2014-07-04 MED ORDER — STERILE WATER FOR IRRIGATION IR SOLN
Status: DC | PRN
  Administered 2014-07-04: 09:00:00

## 2014-07-04 MED ORDER — MEPERIDINE HCL 100 MG/ML IJ SOLN
INTRAMUSCULAR | Status: AC
  Filled 2014-07-04: qty 2

## 2014-07-04 MED ORDER — SODIUM CHLORIDE 0.9 % IV SOLN
INTRAVENOUS | Status: DC
  Administered 2014-07-04: 09:00:00 via INTRAVENOUS

## 2014-07-04 MED ORDER — MEPERIDINE HCL 100 MG/ML IJ SOLN
INTRAMUSCULAR | Status: AC
  Filled 2014-07-04: qty 1

## 2014-07-04 MED ORDER — ONDANSETRON HCL 4 MG/2ML IJ SOLN
INTRAMUSCULAR | Status: AC
  Filled 2014-07-04: qty 2

## 2014-07-04 MED ORDER — MEPERIDINE HCL 100 MG/ML IJ SOLN
INTRAMUSCULAR | Status: DC | PRN
  Administered 2014-07-04: 25 mg via INTRAVENOUS
  Administered 2014-07-04 (×2): 50 mg via INTRAVENOUS

## 2014-07-04 MED ORDER — MIDAZOLAM HCL 5 MG/5ML IJ SOLN
INTRAMUSCULAR | Status: DC | PRN
  Administered 2014-07-04 (×2): 2 mg via INTRAVENOUS
  Administered 2014-07-04: 1 mg via INTRAVENOUS

## 2014-07-04 NOTE — Discharge Instructions (Signed)

## 2014-07-04 NOTE — Op Note (Signed)
[facility] [address] [place], [id number]   COLONOSCOPY PROCEDURE REPORT  PATIENT: [name] [name], [name]  MR#:         [id number] BIRTHDATE: 1966/08/18 , 48  yrs. old GENDER: Male ENDOSCOPIST: R.  Roetta SessionsMichael Rourk, MD FACP FACG REFERRED BY:  Karleen HampshireWilliam McGough, M.D. PROCEDURE DATE:  07/04/2014 PROCEDURE:     Screening colonoscopy  INDICATIONS: Average risk colorectal cancer screening examination  INFORMED CONSENT:  The risks, benefits, alternatives and imponderables including but not limited to bleeding, perforation as well as the possibility of a missed lesion have been reviewed.  The potential for biopsy, lesion removal, etc. have also been discussed.  Questions have been answered.  All parties agreeable. Please see the history and physical in the medical record for more information.  MEDICATIONS: Versed 5 mg IV and Demerol 125 mg IV in divided doses. Zofran 4 mg IV  DESCRIPTION OF PROCEDURE:  After a digital rectal exam was performed, the EC-[id number] ([id number]  colonoscope was advanced from the anus through the rectum and colon to the area of the cecum, ileocecal valve and appendiceal orifice.  The cecum was deeply intubated.  These structures were well-seen and photographed for the record.  From the level of the cecum and ileocecal valve, the scope was slowly and cautiously withdrawn.  The mucosal surfaces were carefully surveyed utilizing scope tip deflection to facilitate fold flattening as needed.  The scope was pulled down into the rectum where a thorough examination including retroflexion was performed.    FINDINGS:  Adequate preparation. Normal rectum. Redundant but otherwise normal-appearing colon. I did employ  external abdominal pressure and changing of the patient's position to reach the cecum.  THERAPEUTIC / DIAGNOSTIC MANEUVERS PERFORMED:  None  COMPLICATIONS: none  CECAL WITHDRAWAL TIME:  8 minutes  IMPRESSION:  Normal colonoscopy (redundant  colon)  RECOMMENDATIONS: Repeat colonoscopy for screening purposes in 10 years   _______________________________ eSigned:  R. Roetta SessionsMichael Rourk, MD FACP [facility] 07/04/2014 9:59 AM   CC:

## 2014-07-04 NOTE — H&P (Signed)
 @LOGO @   Primary Care Physician:  Kirk RuthsMCGOUGH,WILLIAM M, MD Primary Gastroenterologist:  Dr. Jena Gaussourk  Pre-Procedure History & Physical: HPI:  [name] [name] is a 48 y.o. male is here for a screening colonoscopy. Patient reports colonoscopy about 18 years ago for  minor rectal bleeding without significant findings. No bowel symptoms currently. No family history of colon polyps or colon cancer.   Past Medical History  Diagnosis Date  . Hypertension   . Arthritis     SEVERE OA AND PAIN BOTH HIPS   . Low testosterone   . Hypercholesteremia     Past Surgical History  Procedure Laterality Date  . Total hip arthroplasty Right 06/11/2013    Procedure: RIGHT TOTAL HIP ARTHROPLASTY ANTERIOR APPROACH;  Surgeon: Kathryne Hitchhristopher Y Blackman, MD;  Location: [facility];  Service: Orthopedics;  Laterality: Right;  . Total hip arthroplasty Left 10/22/2013    Procedure: LEFT TOTAL HIP ARTHROPLASTY ANTERIOR APPROACH;  Surgeon: Kathryne Hitchhristopher Y Blackman, MD;  Location: [facility];  Service: Orthopedics;  Laterality: Left;    Prior to Admission medications   Medication Sig Start Date End Date Taking? Authorizing Provider  amLODipine (NORVASC) 5 MG tablet Take 5 mg by mouth every morning.    Yes Historical Provider, MD  aspirin EC 325 MG EC tablet Take 1 tablet (325 mg total) by mouth 2 (two) times daily after a meal. 10/23/13  Yes Kathryne Hitchhristopher Y Blackman, MD  fish oil-omega-3 fatty acids 1000 MG capsule Take 1 g by mouth daily.   Yes Historical Provider, MD  losartan-hydrochlorothiazide (HYZAAR) 100-25 MG per tablet Take 1 tablet by mouth daily.   Yes Historical Provider, MD  polyethylene glycol-electrolytes (TRILYTE) 420 G solution Take 4,000 mLs by mouth as directed. 06/20/14  Yes Corbin Adeobert M Dandra Velardi, MD  pravastatin (PRAVACHOL) 10 MG tablet Take 20 mg by mouth at bedtime.    Yes Historical Provider, MD  methocarbamol (ROBAXIN) 500 MG tablet Take 1 tablet (500 mg total) by mouth every 6 (six) hours as needed for muscle spasms.  10/23/13   Kathryne Hitchhristopher Y Blackman, MD  oxyCODONE-acetaminophen (ROXICET) 5-325 MG per tablet Take 1-2 tablets by mouth every 4 (four) hours as needed for severe pain. 10/23/13   Kathryne Hitchhristopher Y Blackman, MD  testosterone cypionate (DEPOTESTOTERONE CYPIONATE) 100 MG/ML injection Inject 100 mg into the muscle every 30 (thirty) days. For IM use only    Historical Provider, MD    Allergies as of 06/15/2014  . (No Known Allergies)    Family History  Problem Relation Age of Onset  . Colon cancer Neg Hx     History   Social History  . Marital Status: Married    Spouse Name: N/A    Number of Children: N/A  . Years of Education: N/A   Occupational History  . Not on file.   Social History Main Topics  . Smoking status: Former Smoker -- 0.50 packs/day for 19 years    Types: Cigarettes    Quit date: 11/18/1997  . Smokeless tobacco: Never Used  . Alcohol Use: No  . Drug Use: No  . Sexual Activity: Not on file   Other Topics Concern  . Not on file   Social History Narrative  . No narrative on file    Review of Systems: See HPI, otherwise negative ROS  Physical Exam: BP 148/77  Pulse 74  Temp(Src) 98.7 F (37.1 C) (Oral)  Resp 20  Ht 5\' 11"  (1.803 m)  Wt 310 lb (140.615 kg)  BMI 43.26 kg/m2  SpO2 95%  General:   Alert,  Well-developed, well-nourished, pleasant and cooperative in NAD Head:  Normocephalic and atraumatic. Eyes:  Sclera clear, no icterus.   Conjunctiva pink. Ears:  Normal auditory acuity. Nose:  No deformity, discharge,  or lesions. Mouth:  No deformity or lesions, dentition normal. Neck:  Supple; no masses or thyromegaly. Lungs:  Clear throughout to auscultation.   No wheezes, crackles, or rhonchi. No acute distress. Heart:  Regular rate and rhythm; no murmurs, clicks, rubs,  or gallops. Abdomen:  Obese Normal bowel sounds, without guarding, and without rebound.   No mass or hepatosplenomegaly Msk:  Symmetrical without gross deformities. Normal  posture. Pulses:  Normal pulses noted. Extremities:  Without clubbing or edema. Neurologic:  Alert and  oriented x4;  grossly normal neurologically. Skin:  Intact without significant lesions or rashes. Cervical Nodes:  No significant cervical adenopathy. Psych:  Alert and cooperative. Normal mood and affect.  Impression/Plan: [name] [name] is now here to undergo a screening colonoscopy. Average risk screening examination. Risks, benefits, limitations, imponderables and alternatives regarding colonoscopy have been reviewed with the patient. Questions have been answered. All parties agreeable.     Notice:  This dictation was prepared with Dragon dictation along with smaller phrase technology. Any transcriptional errors that result from this process are unintentional and may not be corrected upon review.

## 2014-07-07 ENCOUNTER — Encounter (HOSPITAL_COMMUNITY): Payer: Self-pay | Admitting: Internal Medicine

## 2016-07-25 ENCOUNTER — Ambulatory Visit: Payer: Self-pay | Admitting: Cardiovascular Disease

## 2019-10-22 ENCOUNTER — Other Ambulatory Visit: Payer: Self-pay

## 2019-10-22 DIAGNOSIS — Z20822 Contact with and (suspected) exposure to covid-19: Secondary | ICD-10-CM

## 2019-10-24 LAB — NOVEL CORONAVIRUS, NAA: SARS-CoV-2, NAA: NOT DETECTED

## 2020-09-27 ENCOUNTER — Encounter (HOSPITAL_COMMUNITY): Payer: Self-pay | Admitting: Emergency Medicine

## 2020-09-27 ENCOUNTER — Emergency Department (HOSPITAL_COMMUNITY): Payer: Self-pay

## 2020-09-27 ENCOUNTER — Emergency Department (HOSPITAL_COMMUNITY)
Admission: EM | Admit: 2020-09-27 | Discharge: 2020-09-27 | Disposition: A | Discharge status: Home / Self Care | Payer: Self-pay | Attending: Emergency Medicine | Admitting: Emergency Medicine

## 2020-09-27 ENCOUNTER — Other Ambulatory Visit: Payer: Self-pay

## 2020-09-27 DIAGNOSIS — Z87891 Personal history of nicotine dependence: Secondary | ICD-10-CM | POA: Insufficient documentation

## 2020-09-27 DIAGNOSIS — N202 Calculus of kidney with calculus of ureter: Secondary | ICD-10-CM | POA: Insufficient documentation

## 2020-09-27 DIAGNOSIS — I10 Essential (primary) hypertension: Secondary | ICD-10-CM | POA: Insufficient documentation

## 2020-09-27 DIAGNOSIS — Z96643 Presence of artificial hip joint, bilateral: Secondary | ICD-10-CM | POA: Insufficient documentation

## 2020-09-27 DIAGNOSIS — Z79899 Other long term (current) drug therapy: Secondary | ICD-10-CM | POA: Insufficient documentation

## 2020-09-27 DIAGNOSIS — N200 Calculus of kidney: Secondary | ICD-10-CM

## 2020-09-27 LAB — COMPREHENSIVE METABOLIC PANEL
ALT: 24 U/L (ref 0–44)
AST: 27 U/L (ref 15–41)
Albumin: 4.2 g/dL (ref 3.5–5.0)
Alkaline Phosphatase: 46 U/L (ref 38–126)
Anion gap: 8 (ref 5–15)
BUN: 16 mg/dL (ref 6–20)
CO2: 29 mmol/L (ref 22–32)
Calcium: 9 mg/dL (ref 8.9–10.3)
Chloride: 102 mmol/L (ref 98–111)
Creatinine, Ser: 1.5 mg/dL — ABNORMAL HIGH (ref 0.61–1.24)
GFR, Estimated: 54 mL/min — ABNORMAL LOW (ref >60)
Glucose, Bld: 122 mg/dL — ABNORMAL HIGH (ref 70–99)
Potassium: 4.2 mmol/L (ref 3.5–5.1)
Sodium: 139 mmol/L (ref 135–145)
Total Bilirubin: 1 mg/dL (ref 0.3–1.2)
Total Protein: 7.5 g/dL (ref 6.5–8.1)

## 2020-09-27 LAB — URINALYSIS, ROUTINE W REFLEX MICROSCOPIC
Bacteria, UA: NONE SEEN
Bilirubin Urine: NEGATIVE
Glucose, UA: NEGATIVE mg/dL
Ketones, ur: NEGATIVE mg/dL
Leukocytes,Ua: NEGATIVE
Nitrite: NEGATIVE
Protein, ur: 30 mg/dL — AB
Specific Gravity, Urine: 1.026 (ref 1.005–1.030)
pH: 7 (ref 5.0–8.0)

## 2020-09-27 LAB — CBC WITH DIFFERENTIAL/PLATELET
Abs Immature Granulocytes: 0.04 10*3/uL (ref 0.00–0.07)
Basophils Absolute: 0 10*3/uL (ref 0.0–0.1)
Basophils Relative: 0 %
Eosinophils Absolute: 0.1 10*3/uL (ref 0.0–0.5)
Eosinophils Relative: 1 %
HCT: 45.5 % (ref 39.0–52.0)
Hemoglobin: 14.7 g/dL (ref 13.0–17.0)
Immature Granulocytes: 0 %
Lymphocytes Relative: 7 %
Lymphs Abs: 0.8 10*3/uL (ref 0.7–4.0)
MCH: 30.4 pg (ref 26.0–34.0)
MCHC: 32.3 g/dL (ref 30.0–36.0)
MCV: 94 fL (ref 80.0–100.0)
Monocytes Absolute: 0.8 10*3/uL (ref 0.1–1.0)
Monocytes Relative: 7 %
Neutro Abs: 9.4 10*3/uL — ABNORMAL HIGH (ref 1.7–7.7)
Neutrophils Relative %: 85 %
Platelets: 205 10*3/uL (ref 150–400)
RBC: 4.84 MIL/uL (ref 4.22–5.81)
RDW: 12.8 % (ref 11.5–15.5)
WBC: 11.1 10*3/uL — ABNORMAL HIGH (ref 4.0–10.5)
nRBC: 0 % (ref 0.0–0.2)

## 2020-09-27 LAB — LIPASE, BLOOD: Lipase: 35 U/L (ref 11–51)

## 2020-09-27 MED ORDER — SODIUM CHLORIDE 0.9 % IV BOLUS
1000.0000 mL | Freq: Once | INTRAVENOUS | Status: AC
  Administered 2020-09-27: 1000 mL via INTRAVENOUS

## 2020-09-27 MED ORDER — ONDANSETRON HCL 4 MG/2ML IJ SOLN
4.0000 mg | Freq: Once | INTRAMUSCULAR | Status: AC
  Administered 2020-09-27: 4 mg via INTRAVENOUS
  Filled 2020-09-27: qty 2

## 2020-09-27 MED ORDER — KETOROLAC TROMETHAMINE 30 MG/ML IJ SOLN
15.0000 mg | Freq: Once | INTRAMUSCULAR | Status: AC
  Administered 2020-09-27: 15 mg via INTRAVENOUS
  Filled 2020-09-27: qty 1

## 2020-09-27 MED ORDER — ONDANSETRON 4 MG PO TBDP: 4.0000 mg | ORAL_TABLET | Freq: Three times a day (TID) | ORAL | 0 refills | Status: AC | PRN

## 2020-09-27 MED ORDER — FENTANYL CITRATE (PF) 100 MCG/2ML IJ SOLN
100.0000 ug | Freq: Once | INTRAMUSCULAR | Status: AC
  Administered 2020-09-27: 100 ug via INTRAVENOUS
  Filled 2020-09-27: qty 2

## 2020-09-27 MED ORDER — IOHEXOL 300 MG/ML  SOLN
100.0000 mL | Freq: Once | INTRAMUSCULAR | Status: AC | PRN
  Administered 2020-09-27: 100 mL via INTRAVENOUS

## 2020-09-27 MED ORDER — TAMSULOSIN HCL 0.4 MG PO CAPS: 0.4000 mg | ORAL_CAPSULE | Freq: Every day | ORAL | 0 refills | Status: AC

## 2020-09-27 MED ORDER — FENTANYL CITRATE (PF) 100 MCG/2ML IJ SOLN
100.0000 ug | Freq: Once | INTRAMUSCULAR | Status: AC
Start: 2020-09-27 — End: 2020-09-27
  Administered 2020-09-27: 100 ug via INTRAVENOUS
  Filled 2020-09-27: qty 2

## 2020-09-27 MED ORDER — IBUPROFEN 400 MG PO TABS: 400.0000 mg | ORAL_TABLET | Freq: Three times a day (TID) | ORAL | 0 refills | Status: AC

## 2020-09-27 NOTE — ED Provider Notes (Signed)
[facility] EMERGENCY DEPARTMENT Provider Note   CSN: [id number] Arrival date & time: 09/27/20  2563     History Chief Complaint  Patient presents with  . Abdominal Pain    [name] [name] is a 54 y.o. male.  The history is provided by the patient.  Abdominal Pain Pain location:  LUQ Pain quality: aching and cramping   Pain radiates to:  Does not radiate Pain severity:  Moderate Onset quality:  Gradual Duration:  1 day Timing:  Intermittent Progression:  Worsening Chronicity:  New Relieved by: Gas-X. Worsened by:  Movement and palpation Associated symptoms: constipation   Associated symptoms: no chest pain, no diarrhea, no dysuria, no fever, no shortness of breath and no vomiting   Risk factors: has not had multiple surgeries   Patient with history of hypertension, hypercholesterolemia, obesity presents with abdominal pain.  Patient reports left upper quadrant abdominal pain for the past day.  He has not had this previously.  He reports there was some improvement with Gas-X, but the pain is returned.  He reports he has been constipated for several days. He reports using NSAIDs infrequently No recent history of GI bleeding     Past Medical History:  Diagnosis Date  . Arthritis    SEVERE OA AND PAIN BOTH HIPS   . Hypercholesteremia   . Hypertension   . Low testosterone     Patient Active Problem List   Diagnosis Date Noted  . Arthritis pain of left hip 10/22/2013  . Status post THR (total hip replacement) 10/22/2013  . Right leg weakness 06/28/2013  . Difficulty in walking(719.7) 06/28/2013  . Gait disturbance 06/28/2013  . Pain in joint, other specified sites 06/28/2013  . Degenerative arthritis of hip, right 06/11/2013    Past Surgical History:  Procedure Laterality Date  . COLONOSCOPY N/A 07/04/2014   Procedure: COLONOSCOPY;  Surgeon: Corbin Ade, MD;  Location: [facility];  Service: Endoscopy;  Laterality: N/A;  9:45 AM  . TOTAL HIP ARTHROPLASTY  Right 06/11/2013   Procedure: RIGHT TOTAL HIP ARTHROPLASTY ANTERIOR APPROACH;  Surgeon: Kathryne Hitch, MD;  Location: [facility];  Service: Orthopedics;  Laterality: Right;  . TOTAL HIP ARTHROPLASTY Left 10/22/2013   Procedure: LEFT TOTAL HIP ARTHROPLASTY ANTERIOR APPROACH;  Surgeon: Kathryne Hitch, MD;  Location: [facility];  Service: Orthopedics;  Laterality: Left;       Family History  Problem Relation Age of Onset  . Colon cancer Neg Hx     Social History   Tobacco Use  . Smoking status: Former Smoker    Packs/day: 0.50    Years: 19.00    Pack years: 9.50    Types: Cigarettes    Quit date: 11/18/1997    Years since quitting: 22.8  . Smokeless tobacco: Never Used  Substance Use Topics  . Alcohol use: No  . Drug use: No    Home Medications Prior to Admission medications   Medication Sig Start Date End Date Taking? Authorizing Provider  amLODipine (NORVASC) 5 MG tablet Take 5 mg by mouth every morning.     [provider]  aspirin EC 325 MG EC tablet Take 1 tablet (325 mg total) by mouth 2 (two) times daily after a meal. 10/23/13   Kathryne Hitch, MD  fish oil-omega-3 fatty acids 1000 MG capsule Take 1 g by mouth daily.    [provider]  losartan-hydrochlorothiazide (HYZAAR) 100-25 MG per tablet Take 1 tablet by mouth daily.    [provider]  pravastatin (PRAVACHOL) 10 MG tablet Take 20 mg by mouth at bedtime.     [provider]  testosterone cypionate (DEPOTESTOTERONE CYPIONATE) 100 MG/ML injection Inject 100 mg into the muscle every 30 (thirty) days. For IM use only    [provider]    Allergies    Patient has no known allergies.  Review of Systems   Review of Systems  Constitutional: Negative for fever.  Respiratory: Negative for shortness of breath.   Cardiovascular: Negative for chest pain.  Gastrointestinal: Positive for abdominal pain and constipation. Negative for blood in stool, diarrhea and  vomiting.  Genitourinary: Negative for dysuria and frequency.  All other systems reviewed and are negative.   Physical Exam Updated Vital Signs BP (!) 148/86 (BP Location: Left Arm)   Pulse 70   Temp 99.5 F (37.5 C) (Oral)   Resp 20   Ht 1.803 m (5\' 11" )   Wt (!) 145.2 kg   SpO2 95%   BMI 44.63 kg/m   Physical Exam CONSTITUTIONAL: Well developed/well nourished, uncomfortable appearing HEAD: Normocephalic/atraumatic EYES: EOMI/PERRL, no icterus ENMT: Mucous membranes moist NECK: supple no meningeal signs SPINE/BACK:entire spine nontender CV: S1/S2 noted, no murmurs/rubs/gallops noted LUNGS: Lungs are clear to auscultation bilaterally, no apparent distress ABDOMEN: soft, obese.  Moderate LUQ/LLQ tenderness.  There are decreased bowel sounds noted throughout abdomen There is no obvious abdominal wall hernias GU:no cva tenderness NEURO: Pt is awake/alert/appropriate, moves all extremitiesx4.  No facial droop.   EXTREMITIES: pulses normal/equal, full ROM SKIN: warm, color normal PSYCH: no abnormalities of mood noted, alert and oriented to situation  ED Results / Procedures / Treatments   Labs (all labs ordered are listed, but only abnormal results are displayed) Labs Reviewed  CBC WITH DIFFERENTIAL/PLATELET - Abnormal; Notable for the following components:      Result Value   WBC 11.1 (*)    Neutro Abs 9.4 (*)    All other components within normal limits  COMPREHENSIVE METABOLIC PANEL - Abnormal; Notable for the following components:   Glucose, Bld 122 (*)    Creatinine, Ser 1.50 (*)    GFR, Estimated 54 (*)    All other components within normal limits  LIPASE, BLOOD  URINALYSIS, ROUTINE W REFLEX MICROSCOPIC    EKG  ED ECG REPORT   Date: 09/27/2020 0613am  Rate: 71  Rhythm: normal sinus rhythm  QRS Axis: left  Intervals: normal  ST/T Wave abnormalities: normal  Conduction Disutrbances:none  Narrative Interpretation:   Old EKG Reviewed: unchanged  I have  personally reviewed the EKG tracing and agree with the computerized printout as noted.  Radiology No results found.  Procedures Procedures   Medications Ordered in ED Medications  fentaNYL (SUBLIMAZE) injection 100 mcg (100 mcg Intravenous Given 09/27/20 0555)  ondansetron (ZOFRAN) injection 4 mg (4 mg Intravenous Given 09/27/20 0555)  fentaNYL (SUBLIMAZE) injection 100 mcg (100 mcg Intravenous Given 09/27/20 0647)  iohexol (OMNIPAQUE) 300 MG/ML solution 100 mL (100 mLs Intravenous Contrast Given 09/27/20 13/10/21)    ED Course  I have reviewed the triage vital signs and the nursing notes.  Pertinent labs  results that were available during my care of the patient were reviewed by me and considered in my medical decision making (see chart for details).    MDM Rules/Calculators/A&P                           This patient presents to the ED for concern  of abdominal pain, this involves an extensive number of treatment options, and is a complaint that carries with it a high risk of complications and morbidity.  The differential diagnosis includes bowel obstruction, bowel perforation, peptic ulcer disease, pancreatitis, cholecystitis, diverticulitis, UTI, kidney stone   Lab Tests:   I Ordered, reviewed, and interpreted labs, which included urinalysis, electrolytes, liver function panel, complete blood count, lipase  Medicines ordered:   I ordered medication fentanyl for pain  Imaging Studies ordered:   I ordered imaging studies which included CT abdomen pelvis    Additional history obtained:   Previous records obtained and reviewed    Reevaluation:  After the interventions stated above, I reevaluated the patient and found still with left upper and lower abdominal pain  6:48 AM Patient with persistent left upper and lower quadrant abdominal pain.  Due to persistent pain this is a new finding for patient, will proceed with CT abdomen/pelvis  patient agreeable w/plan 7:16  AM Signed out to Dr. Jeraldine Loots at shift change   Final Clinical Impression(s) / ED Diagnoses Final diagnoses:  None    Rx / DC Orders ED Discharge Orders    None       Zadie Rhine, MD 09/27/20 [phone]

## 2020-09-27 NOTE — ED Triage Notes (Signed)
Pt c/o left sided abd pain that started yesterday. Pt states he has also had constipation for a while now.

## 2020-09-27 NOTE — ED Notes (Signed)
Pt placed on 2L [place] d/t O2 dropping to 77% on room air after Fentanyl.

## 2020-09-27 NOTE — Discharge Instructions (Signed)
As discussed, today's evaluation has demonstrated the presence of kidney stone.  Please take all medication as directed, stay well hydrated and monitor your condition carefully.  Return here for concerning changes in your condition. Otherwise follow-up with our urology colleagues.

## 2020-09-27 NOTE — ED Provider Notes (Signed)
8:07 AM Patient awake, alert, feeling better after having received Toradol.  He, his wife and I discussed findings, including mild leukocytosis, CT findings consistent with 2 mm kidney stone.  No evidence for complete obstruction, nor infection.  Given his improvement here patient is appropriate for, amenable to discharge with outpatient follow-up, ongoing oral meds.   Gerhard Munch, MD 09/27/20 [phone]

## 2022-05-23 IMAGING — CT CT ABD-PELV W/ CM
2 of 4 series · 17 of 46 positions shown, 19 images · IV contrast (Omnipaque or Isovue)
Comparison: None.

CLINICAL DATA: Bowel obstruction suspected.  Abdominal pain.

EXAM:
CT ABDOMEN AND PELVIS WITH CONTRAST
TECHNIQUE: Multidetector CT imaging of the abdomen and pelvis was performed
using the standard protocol following bolus administration of
intravenous contrast.
CONTRAST:  100mL OMNIPAQUE IOHEXOL 300 MG/ML  SOLN

[Series 2: axial st · axial · 0.98mm/px · z∈[-605,-130]mm · 14 of 105 slices shown, 16 images]
[im 5/105  soft-tissue]
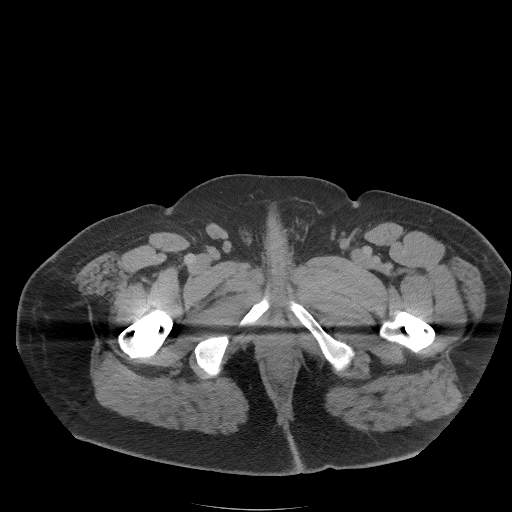
[im 5/105  bone]
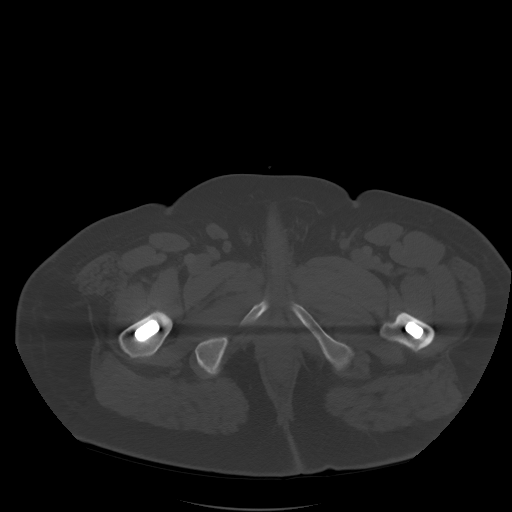
[im 15/105  soft-tissue]
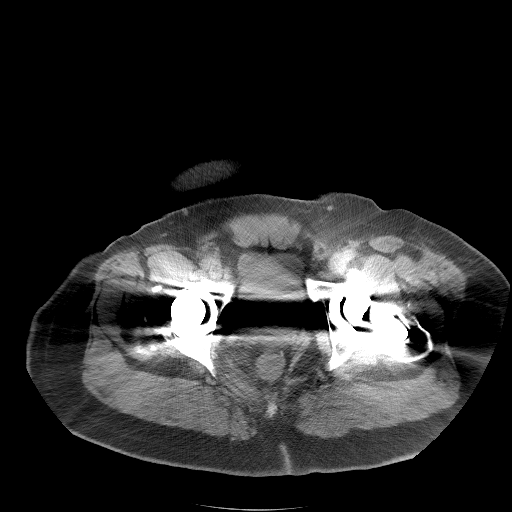
[im 19/105  soft-tissue]
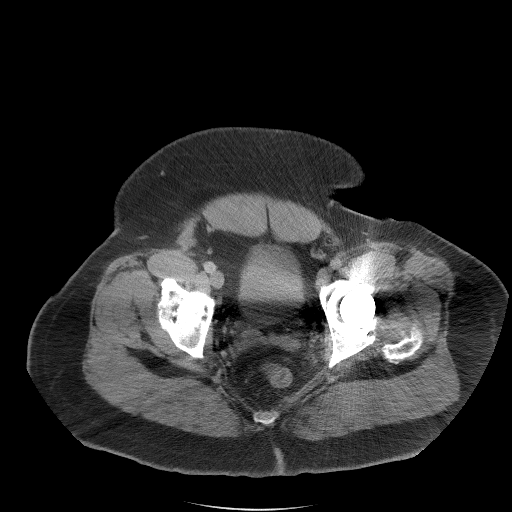
[im 29/105  soft-tissue]
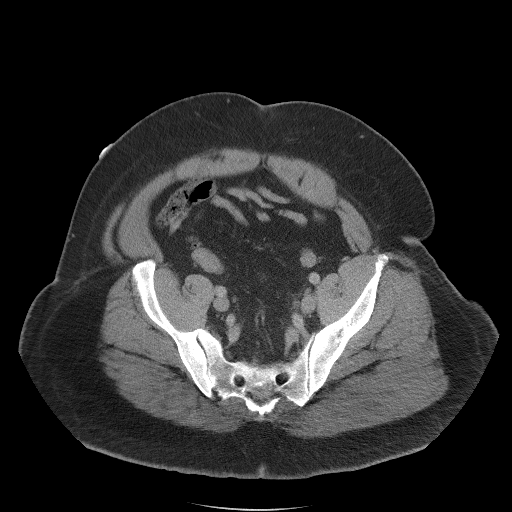
[im 34/105  soft-tissue]
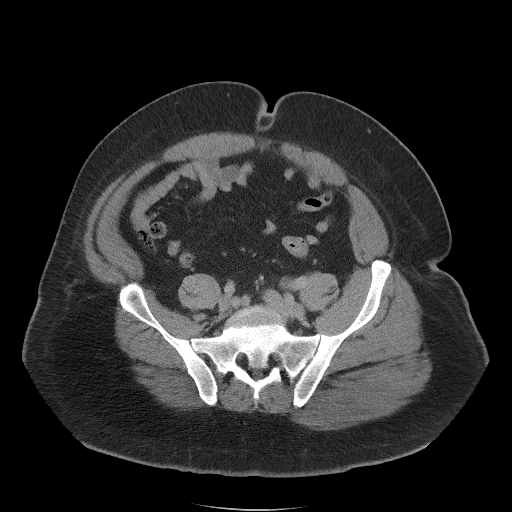
[im 43/105  soft-tissue]
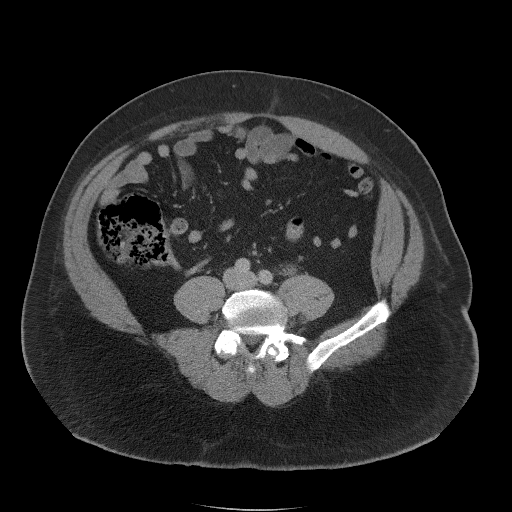
[im 48/105  soft-tissue]
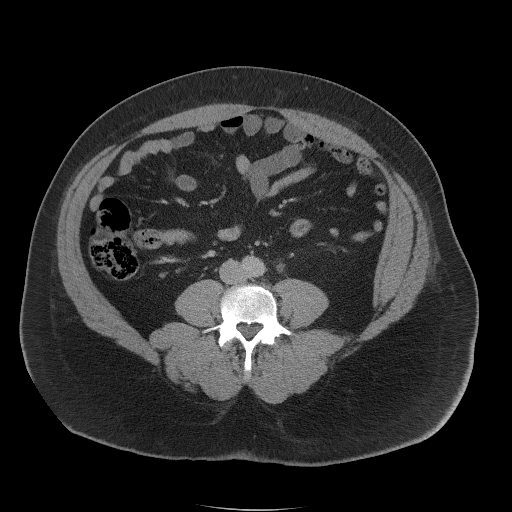
[im 57/105  soft-tissue]
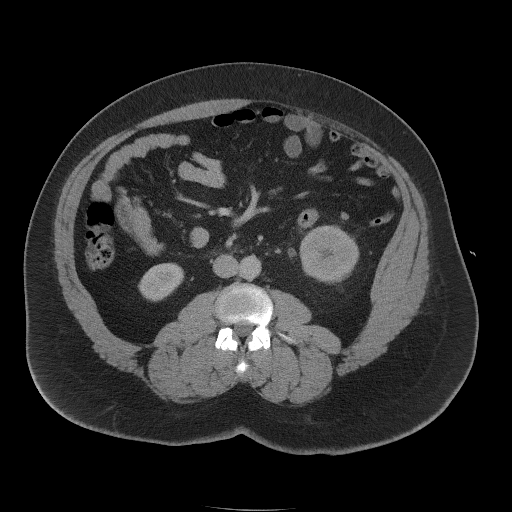
[im 62/105  soft-tissue]
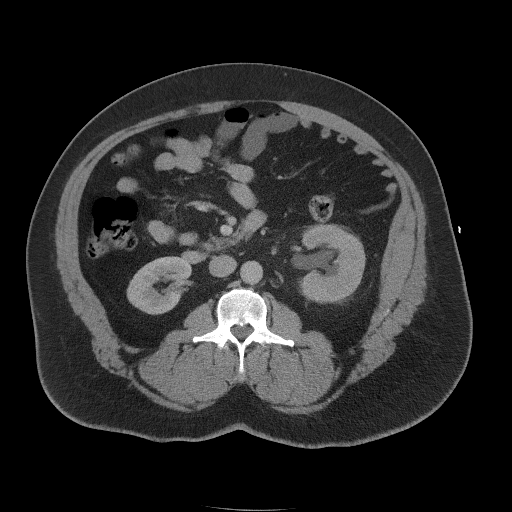
[im 62/105  bone]
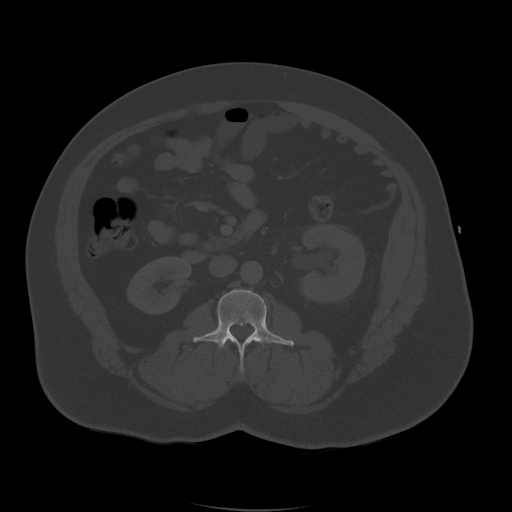
[im 71/105  soft-tissue]
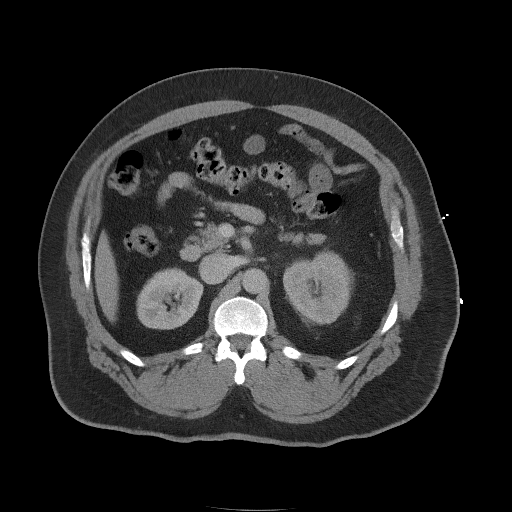
[im 76/105  soft-tissue]
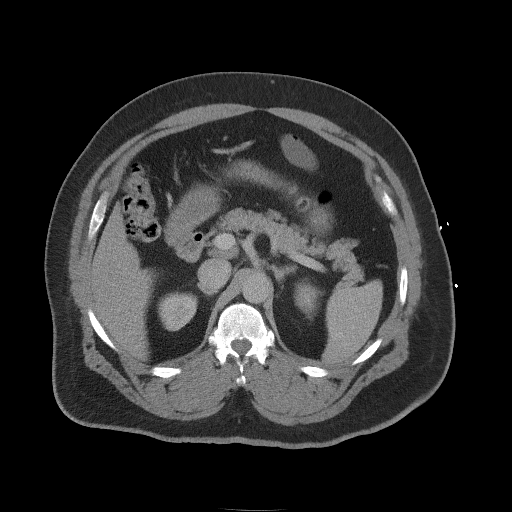
[im 86/105  soft-tissue]
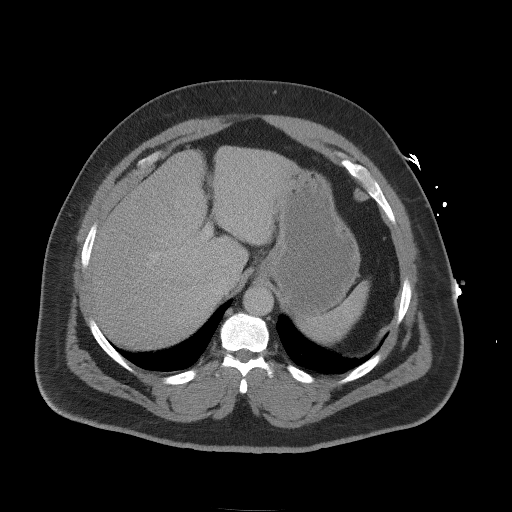
[im 90/105  soft-tissue]
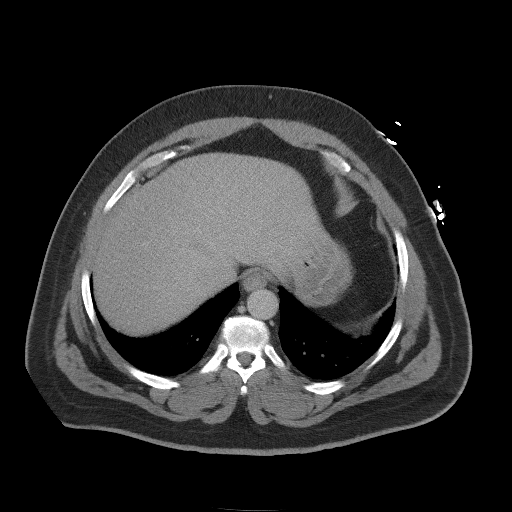
[im 100/105  soft-tissue]
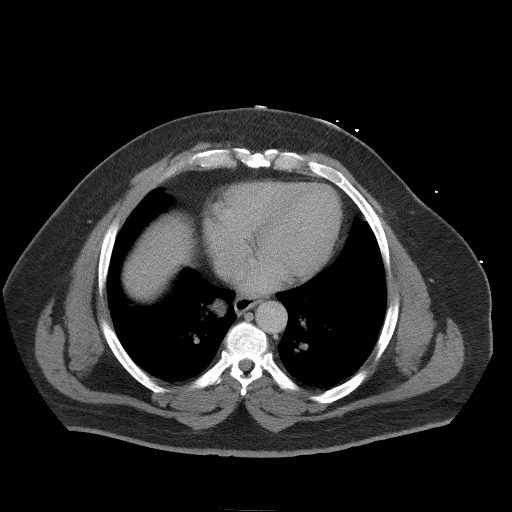

[Series 4: coronal st · coronal · 0.98mm/px · 3 of 130 slices shown]
[im 44/130  soft-tissue]
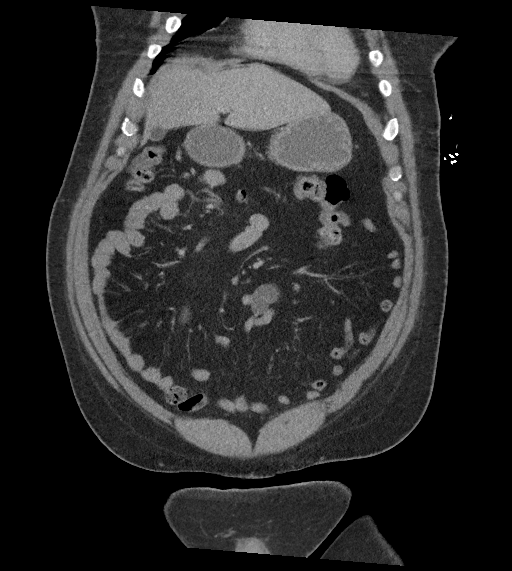
[im 58/130  soft-tissue]
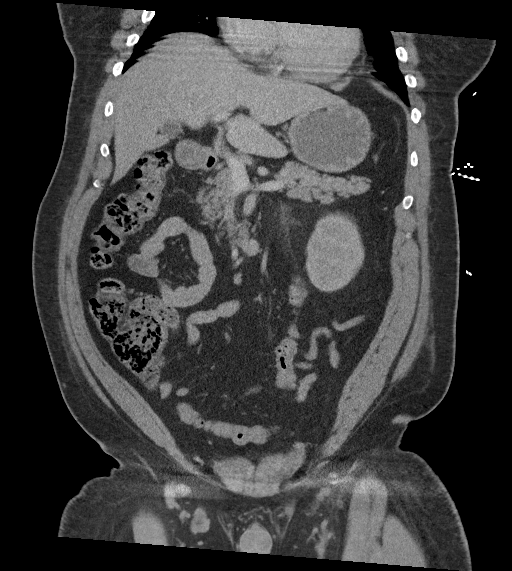
[im 72/130  soft-tissue]
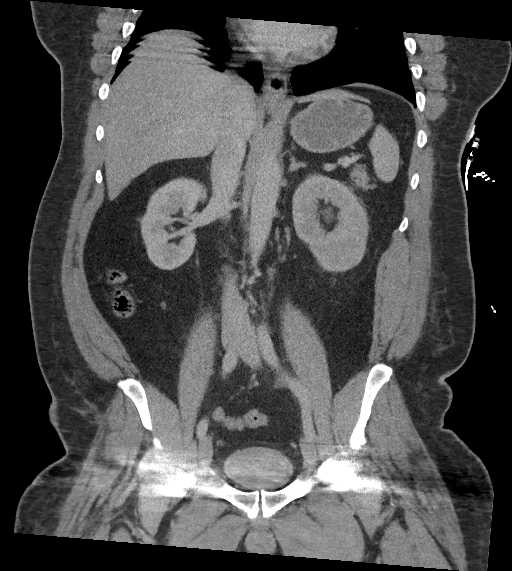

[17 of 46 positions shown; findings below may reference images not displayed]

FINDINGS: Lower chest:  No contributory findings.

Hepatobiliary: No focal liver abnormality.No evidence of biliary
obstruction or stone.

Pancreas: Unremarkable.

Spleen: Unremarkable.

Adrenals/Urinary Tract: Negative adrenals. Left
hydroureteronephrosis to the level of a 2 mm distal ureteral
calculus. There is associated delayed left renal excretion. No
additional urolithiasis. Unremarkable bladder.

Stomach/Bowel:  No obstruction. No evidence of bowel inflammation.

Vascular/Lymphatic: No acute vascular abnormality. Atheromatous
calcifications of the aorta. No mass or adenopathy.

Reproductive:No pathologic findings.

Other: No ascites or pneumoperitoneum.

Musculoskeletal: No acute abnormalities. Lumbar spine degeneration
and bilateral hip arthroplasty.
IMPRESSION: Obstructing 2 mm distal left ureteral calculus.

## 2022-06-13 ENCOUNTER — Other Ambulatory Visit (HOSPITAL_COMMUNITY): Payer: Self-pay | Admitting: Internal Medicine

## 2022-06-13 ENCOUNTER — Ambulatory Visit (HOSPITAL_COMMUNITY)
Admission: RE | Admit: 2022-06-13 | Discharge: 2022-06-13 | Disposition: A | Discharge status: Home / Self Care | Payer: 59 | Source: Ambulatory Visit | Attending: Internal Medicine | Admitting: Internal Medicine

## 2022-06-13 ENCOUNTER — Other Ambulatory Visit: Payer: Self-pay | Admitting: Internal Medicine

## 2022-06-13 DIAGNOSIS — R600 Localized edema: Secondary | ICD-10-CM | POA: Insufficient documentation

## 2022-06-13 DIAGNOSIS — M79605 Pain in left leg: Secondary | ICD-10-CM

## 2022-08-09 DIAGNOSIS — Z6841 Body Mass Index (BMI) 40.0 and over, adult: Secondary | ICD-10-CM | POA: Diagnosis not present

## 2022-08-09 DIAGNOSIS — E291 Testicular hypofunction: Secondary | ICD-10-CM | POA: Diagnosis not present

## 2022-08-09 DIAGNOSIS — G4733 Obstructive sleep apnea (adult) (pediatric): Secondary | ICD-10-CM | POA: Diagnosis not present

## 2022-08-09 DIAGNOSIS — N400 Enlarged prostate without lower urinary tract symptoms: Secondary | ICD-10-CM | POA: Diagnosis not present

## 2022-08-09 DIAGNOSIS — I872 Venous insufficiency (chronic) (peripheral): Secondary | ICD-10-CM | POA: Diagnosis not present

## 2022-09-30 DIAGNOSIS — G4733 Obstructive sleep apnea (adult) (pediatric): Secondary | ICD-10-CM | POA: Diagnosis not present

## 2022-11-21 DIAGNOSIS — G4733 Obstructive sleep apnea (adult) (pediatric): Secondary | ICD-10-CM | POA: Diagnosis not present

## 2024-05-27 ENCOUNTER — Encounter (INDEPENDENT_AMBULATORY_CARE_PROVIDER_SITE_OTHER): Payer: Self-pay | Admitting: *Deleted

## 2024-07-25 ENCOUNTER — Ambulatory Visit
Admission: EM | Admit: 2024-07-25 | Discharge: 2024-07-25 | Disposition: A | Discharge status: Home / Self Care | Payer: Self-pay | Attending: Nurse Practitioner | Admitting: Nurse Practitioner

## 2024-07-25 DIAGNOSIS — M25561 Pain in right knee: Secondary | ICD-10-CM

## 2024-07-25 DIAGNOSIS — M79661 Pain in right lower leg: Secondary | ICD-10-CM

## 2024-07-25 MED ORDER — NAPROXEN 500 MG PO TABS: 500.0000 mg | ORAL_TABLET | Freq: Two times a day (BID) | ORAL | 0 refills | Status: AC

## 2024-07-25 NOTE — Discharge Instructions (Addendum)
 You were seen today for pain in your knee that has been present for about two weeks. There are no signs of infection, nerve involvement, or other serious problems. Your symptoms are most consistent with musculoskeletal pain, likely related to strain or injury in the knee area. You have been prescribed naproxen  to take twice daily for 10 days. Do not take any additional over-the-counter NSAIDs such as ibuprofen  (Advil , Motrin ) or Aleve  while on this medication, as combining them can cause stomach, kidney, or bleeding issues.  If needed, you may take Tylenol  (acetaminophen ) 1000 mg every six hours for additional pain relief. This equals two 500 mg tablets at a time. Be careful not to take more than 4000 mg of Tylenol  in a 24-hour period.   At home, you may apply ice to the painful area several times per day for 15-20 minutes at a time, making sure to place a towel between the ice pack and your skin to avoid injury. Resting the knee and avoiding activities that worsen the pain will also help. Gentle stretching or low-impact activity, such as short walks, may be tolerated as symptoms improve.  If your pain does not start to improve after completing the medication, you should schedule a follow-up appointment with an orthopedic specialist for further evaluation. You should seek emergency care right away if you develop sudden severe pain, inability to bear weight on the leg, swelling, redness, fever, or numbness or weakness in the leg, as these may be signs of a more serious problem.

## 2024-07-25 NOTE — ED Provider Notes (Signed)
 [facility]    CSN: [id number] Arrival date & time: 07/25/24  1338      History   Chief Complaint No chief complaint on file.   HPI [name] [name] is a 58 y.o. male.   Discussed the use of AI scribe software for clinical note transcription with the patient, who gave verbal consent to proceed.   The patient presents with a two-week history of pain localized to the side of the knee. The discomfort is present both during the day and at night. He reports a prior injury but is able to pinpoint the pain to a specific area near the knee on palpation. He denies numbness, tingling, weakness, or loss of sensation in the affected leg. There has been no noticeable swelling. The patient has a history of arthritis in the opposite knee but states the currently affected knee has not previously caused problems. He has tried Motrin  on a few occasions for symptom relief but has not attempted any other interventions.  The following portions of the patient's history were reviewed and updated as appropriate: allergies, current medications, past family history, past medical history, past social history, past surgical history, and problem list.    Past Medical History:  Diagnosis Date   Arthritis    SEVERE OA AND PAIN BOTH HIPS    Hypercholesteremia    Hypertension    Low testosterone     Patient Active Problem List   Diagnosis Date Noted   Arthritis pain of left hip 10/22/2013   Status post THR (total hip replacement) 10/22/2013   Right leg weakness 06/28/2013   Difficulty walking 06/28/2013   Gait disturbance 06/28/2013   Pain in joint, other specified sites 06/28/2013   Degenerative arthritis of hip, right 06/11/2013    Past Surgical History:  Procedure Laterality Date   COLONOSCOPY N/A 07/04/2014   Procedure: COLONOSCOPY;  Surgeon: Lamar CHRISTELLA Hollingshead, MD;  Location: [facility];  Service: Endoscopy;  Laterality: N/A;  9:45 AM   TOTAL HIP ARTHROPLASTY Right 06/11/2013   Procedure:  RIGHT TOTAL HIP ARTHROPLASTY ANTERIOR APPROACH;  Surgeon: Lonni CINDERELLA Poli, MD;  Location: [facility];  Service: Orthopedics;  Laterality: Right;   TOTAL HIP ARTHROPLASTY Left 10/22/2013   Procedure: LEFT TOTAL HIP ARTHROPLASTY ANTERIOR APPROACH;  Surgeon: Lonni CINDERELLA Poli, MD;  Location: [facility];  Service: Orthopedics;  Laterality: Left;       Home Medications    Prior to Admission medications   Medication Sig Start Date End Date Taking? Authorizing Provider  naproxen  (NAPROSYN ) 500 MG tablet Take 1 tablet (500 mg total) by mouth 2 (two) times daily with a meal. Take with food to avoid stomach upset. Do not take any additional NSAIDs while on this. You may take tylenol  in addition to this if needed for extra pain relief. 07/25/24  Yes Iola Lukes, FNP  amLODipine  (NORVASC ) 5 MG tablet Take 5 mg by mouth every morning.     [provider]  aspirin  EC 325 MG EC tablet Take 1 tablet (325 mg total) by mouth 2 (two) times daily after a meal. 10/23/13   Poli Lonni CINDERELLA, MD  fish oil-omega-3 fatty acids 1000 MG capsule Take 1 g by mouth daily.    [provider]  losartan-hydrochlorothiazide (HYZAAR) 100-25 MG per tablet Take 1 tablet by mouth daily.    [provider]  ondansetron  (ZOFRAN  ODT) 4 MG disintegrating tablet Take 1 tablet (4 mg total) by mouth every 8 (eight) hours as needed for nausea or  vomiting. 09/27/20   Garrick Charleston, MD  pravastatin (PRAVACHOL) 10 MG tablet Take 20 mg by mouth at bedtime.     [provider]  testosterone cypionate (DEPOTESTOTERONE CYPIONATE) 100 MG/ML injection Inject 100 mg into the muscle every 30 (thirty) days. For IM use only    [provider]    Family History Family History  Problem Relation Age of Onset   Colon cancer Neg Hx     Social History Social History   Tobacco Use   Smoking status: Former    Current packs/day: 0.00    Average packs/day: 0.5 packs/day for 19.0 years (9.5  ttl pk-yrs)    Types: Cigarettes    Start date: 11/18/1978    Quit date: 11/18/1997    Years since quitting: 26.7   Smokeless tobacco: Never  Substance Use Topics   Alcohol use: No   Drug use: No     Allergies   Patient has no known allergies.   Review of Systems Review of Systems  Musculoskeletal:  Positive for arthralgias and gait problem.  Neurological:  Negative for weakness and numbness.  All other systems reviewed and are negative.    Physical Exam Triage Vital Signs ED Triage Vitals  Encounter Vitals Group     BP 07/25/24 1505 (!) 143/76     Girls Systolic BP Percentile --      Girls Diastolic BP Percentile --      Boys Systolic BP Percentile --      Boys Diastolic BP Percentile --      Pulse Rate 07/25/24 1505 74     Resp 07/25/24 1505 20     Temp 07/25/24 1505 98.7 F (37.1 C)     Temp Source 07/25/24 1505 Oral     SpO2 07/25/24 1505 94 %     Weight --      Height --      Head Circumference --      Peak Flow --      Pain Score 07/25/24 1508 7     Pain Loc --      Pain Education --      Exclude from Growth Chart --    No data found.  Updated Vital Signs BP (!) 143/76 (BP Location: Right Arm)   Pulse 74   Temp 98.7 F (37.1 C) (Oral)   Resp 20   SpO2 94%   Visual Acuity Right Eye Distance:   Left Eye Distance:   Bilateral Distance:    Right Eye Near:   Left Eye Near:    Bilateral Near:     Physical Exam Vitals reviewed.  Constitutional:      General: He is awake. He is not in acute distress.    Appearance: Normal appearance. He is well-developed. He is not ill-appearing, toxic-appearing or diaphoretic.  HENT:     Head: Normocephalic.     Right Ear: Hearing normal.     Left Ear: Hearing normal.     Nose: Nose normal.     Mouth/Throat:     Mouth: Mucous membranes are moist.  Eyes:     General: Vision grossly intact.     Conjunctiva/sclera: Conjunctivae normal.  Cardiovascular:     Rate and Rhythm: Normal rate and regular rhythm.      Heart sounds: Normal heart sounds.  Pulmonary:     Effort: Pulmonary effort is normal.     Breath sounds: Normal breath sounds and air entry.  Musculoskeletal:  General: Normal range of motion.     Cervical back: Full passive range of motion without pain, normal range of motion and neck supple.     Right knee: Normal. No swelling, deformity or effusion. Normal range of motion. No tenderness.     Right lower leg: Tenderness present. No swelling, deformity or bony tenderness. No edema.     Comments: Tenderness noted along the medial aspect of the right lower leg just medial to the knee. No swelling, deformity, or limitation in range of motion of the lower leg or knee. Strength and sensation are intact.  Skin:    General: Skin is warm and dry.  Neurological:     General: No focal deficit present.     Mental Status: He is alert and oriented to person, place, and time.  Psychiatric:        Speech: Speech normal.        Behavior: Behavior is cooperative.      UC Treatments / Results  Labs (all labs ordered are listed, but only abnormal results are displayed) Labs Reviewed - No data to display  EKG   Radiology No results found.  Procedures Procedures (including critical care time)  Medications Ordered in UC Medications - No data to display  Initial Impression / Assessment and Plan / UC Course  I have reviewed the triage vital signs and the nursing notes.  Pertinent labs & imaging results that were available during my care of the patient were reviewed by me and considered in my medical decision making (see chart for details).   The patient presents with a two-week history of localized knee pain without swelling, numbness, tingling, or weakness. Exam findings are consistent with acute musculoskeletal pain rather than inflammatory or neurologic etiology. He was prescribed naproxen  to be taken twice daily for 10 days, with instructions to avoid additional over-the-counter  NSAIDs. Supportive care includes applying ice to the affected area several times per day. There is no evidence of infection on exam. The patient was advised to follow up with orthopedics if symptoms do not improve.  Final Clinical Impressions(s) / UC Diagnoses   Final diagnoses:  Pain of right knee and lower leg     Discharge Instructions      You were seen today for pain in your knee that has been present for about two weeks. There are no signs of infection, nerve involvement, or other serious problems. Your symptoms are most consistent with musculoskeletal pain, likely related to strain or injury in the knee area. You have been prescribed naproxen  to take twice daily for 10 days. Do not take any additional over-the-counter NSAIDs such as ibuprofen  (Advil , Motrin ) or Aleve  while on this medication, as combining them can cause stomach, kidney, or bleeding issues.  If needed, you may take Tylenol  (acetaminophen ) 1000 mg every six hours for additional pain relief. This equals two 500 mg tablets at a time. Be careful not to take more than 4000 mg of Tylenol  in a 24-hour period.   At home, you may apply ice to the painful area several times per day for 15-20 minutes at a time, making sure to place a towel between the ice pack and your skin to avoid injury. Resting the knee and avoiding activities that worsen the pain will also help. Gentle stretching or low-impact activity, such as short walks, may be tolerated as symptoms improve.  If your pain does not start to improve after completing the medication, you should schedule a follow-up appointment  with an orthopedic specialist for further evaluation. You should seek emergency care right away if you develop sudden severe pain, inability to bear weight on the leg, swelling, redness, fever, or numbness or weakness in the leg, as these may be signs of a more serious problem.     ED Prescriptions     Medication Sig Dispense Auth. Provider   naproxen   (NAPROSYN ) 500 MG tablet Take 1 tablet (500 mg total) by mouth 2 (two) times daily with a meal. Take with food to avoid stomach upset. Do not take any additional NSAIDs while on this. You may take tylenol  in addition to this if needed for extra pain relief. 20 tablet Iola Lukes, FNP      PDMP not reviewed this encounter.   Iola Lukes, [place] 07/25/24 [phone]

## 2024-07-25 NOTE — ED Triage Notes (Signed)
 Pt reports right  leg pain started 2 weeks, denies injury.
